# Patient Record
Sex: Male | Born: 1935 | Race: White | Hispanic: No | Marital: Married | Smoking: Former smoker
Health system: Southern US, Community
[De-identification: ages and names within clinical notes are randomized; demographics above are authoritative.]

## PROBLEM LIST (undated history)

## (undated) DIAGNOSIS — M1990 Unspecified osteoarthritis, unspecified site: Secondary | ICD-10-CM

## (undated) DIAGNOSIS — D649 Anemia, unspecified: Secondary | ICD-10-CM

## (undated) DIAGNOSIS — R0602 Shortness of breath: Secondary | ICD-10-CM

## (undated) DIAGNOSIS — E119 Type 2 diabetes mellitus without complications: Secondary | ICD-10-CM

## (undated) DIAGNOSIS — I10 Essential (primary) hypertension: Secondary | ICD-10-CM

## (undated) DIAGNOSIS — K219 Gastro-esophageal reflux disease without esophagitis: Secondary | ICD-10-CM

## (undated) DIAGNOSIS — J449 Chronic obstructive pulmonary disease, unspecified: Secondary | ICD-10-CM

## (undated) DIAGNOSIS — I219 Acute myocardial infarction, unspecified: Secondary | ICD-10-CM

## (undated) DIAGNOSIS — I739 Peripheral vascular disease, unspecified: Secondary | ICD-10-CM

## (undated) DIAGNOSIS — I499 Cardiac arrhythmia, unspecified: Secondary | ICD-10-CM

## (undated) DIAGNOSIS — C3430 Malignant neoplasm of lower lobe, unspecified bronchus or lung: Secondary | ICD-10-CM

## (undated) DIAGNOSIS — E785 Hyperlipidemia, unspecified: Secondary | ICD-10-CM

## (undated) DIAGNOSIS — F419 Anxiety disorder, unspecified: Secondary | ICD-10-CM

## (undated) DIAGNOSIS — I2510 Atherosclerotic heart disease of native coronary artery without angina pectoris: Secondary | ICD-10-CM

## (undated) DIAGNOSIS — Z5189 Encounter for other specified aftercare: Secondary | ICD-10-CM

## (undated) DIAGNOSIS — I509 Heart failure, unspecified: Secondary | ICD-10-CM

## (undated) DIAGNOSIS — R918 Other nonspecific abnormal finding of lung field: Secondary | ICD-10-CM

## (undated) DIAGNOSIS — F329 Major depressive disorder, single episode, unspecified: Secondary | ICD-10-CM

## (undated) DIAGNOSIS — F32A Depression, unspecified: Secondary | ICD-10-CM

## (undated) DIAGNOSIS — Z923 Personal history of irradiation: Secondary | ICD-10-CM

## (undated) DIAGNOSIS — J189 Pneumonia, unspecified organism: Secondary | ICD-10-CM

## (undated) DIAGNOSIS — Z9981 Dependence on supplemental oxygen: Secondary | ICD-10-CM

## (undated) DIAGNOSIS — IMO0001 Reserved for inherently not codable concepts without codable children: Secondary | ICD-10-CM

## (undated) HISTORY — PX: CATARACT EXTRACTION W/ INTRAOCULAR LENS IMPLANT: SHX1309

## (undated) HISTORY — PX: CORONARY ANGIOPLASTY WITH STENT PLACEMENT: SHX49

## (undated) HISTORY — DX: Hyperlipidemia, unspecified: E78.5

## (undated) HISTORY — DX: Malignant neoplasm of lower lobe, unspecified bronchus or lung: C34.30

## (undated) HISTORY — DX: Chronic obstructive pulmonary disease, unspecified: J44.9

## (undated) HISTORY — PX: TONSILLECTOMY: SUR1361

## (undated) HISTORY — DX: Essential (primary) hypertension: I10

## (undated) HISTORY — DX: Other nonspecific abnormal finding of lung field: R91.8

---
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
## 1979-08-27 DIAGNOSIS — I219 Acute myocardial infarction, unspecified: Secondary | ICD-10-CM

## 1979-08-27 HISTORY — DX: Acute myocardial infarction, unspecified: I21.9

## 1989-08-26 DIAGNOSIS — J189 Pneumonia, unspecified organism: Secondary | ICD-10-CM

## 1989-08-26 HISTORY — DX: Pneumonia, unspecified organism: J18.9

## 2006-04-12 ENCOUNTER — Emergency Department (HOSPITAL_COMMUNITY): Admission: EM | Admit: 2006-04-12 | Discharge: 2006-04-12 | Payer: Self-pay | Admitting: Emergency Medicine

## 2009-06-08 ENCOUNTER — Encounter: Admission: RE | Admit: 2009-06-08 | Discharge: 2009-06-08 | Payer: Self-pay | Admitting: Cardiology

## 2009-06-12 ENCOUNTER — Ambulatory Visit (HOSPITAL_COMMUNITY): Admission: RE | Admit: 2009-06-12 | Discharge: 2009-06-12 | Payer: Self-pay | Admitting: Cardiology

## 2010-02-04 ENCOUNTER — Encounter: Payer: Self-pay | Admitting: Internal Medicine

## 2010-02-16 ENCOUNTER — Ambulatory Visit: Payer: Self-pay | Admitting: Internal Medicine

## 2010-02-16 DIAGNOSIS — R0609 Other forms of dyspnea: Secondary | ICD-10-CM

## 2010-02-16 DIAGNOSIS — J449 Chronic obstructive pulmonary disease, unspecified: Secondary | ICD-10-CM

## 2010-02-16 DIAGNOSIS — R0989 Other specified symptoms and signs involving the circulatory and respiratory systems: Secondary | ICD-10-CM

## 2010-02-16 DIAGNOSIS — J4489 Other specified chronic obstructive pulmonary disease: Secondary | ICD-10-CM | POA: Insufficient documentation

## 2010-02-16 DIAGNOSIS — E785 Hyperlipidemia, unspecified: Secondary | ICD-10-CM

## 2010-02-16 DIAGNOSIS — I10 Essential (primary) hypertension: Secondary | ICD-10-CM | POA: Insufficient documentation

## 2010-03-18 ENCOUNTER — Ambulatory Visit: Payer: Self-pay | Admitting: Internal Medicine

## 2010-04-15 ENCOUNTER — Ambulatory Visit: Payer: Self-pay | Admitting: Internal Medicine

## 2010-04-26 ENCOUNTER — Telehealth (INDEPENDENT_AMBULATORY_CARE_PROVIDER_SITE_OTHER): Payer: Self-pay | Admitting: *Deleted

## 2010-04-27 ENCOUNTER — Ambulatory Visit: Payer: Self-pay | Admitting: Internal Medicine

## 2010-12-26 HISTORY — PX: COLONOSCOPY: SHX174

## 2011-01-25 NOTE — Assessment & Plan Note (Signed)
Summary: Pulmonary/ f/u ov better off ACE   Copy to:  Dr. Gilmore Laroche Primary Provider/Referring Provider:  Dr Gilmore Laroche  CC:  1 month followup.  Pt states that his cough and breathing have improved.  He still c/o occ dry cough that he notices most in the late afternoons.  He also still has occ SOB with exertion but states that now it takes more strenuous activity to make him SOB.  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 dry cough and hoarseness,  much worse with certain positions of neck.   rec stop ace, take zantac 150 mg twice daily and w/in few days.  March 18, 2010 1 month followup.  Pt states that his cough and breathing have improved.  He still c/o occ dry cough that he notices most in the late afternoons.  He also still has occ SOB with exertion but states that now it takes more strenuous activity to make him SOB.  Pt denies any significant sore throat, dysphagia, itching, sneezing,  nasal congestion or excess secretions,  fever, chills, sweats, unintended wt loss, pleuritic or exertional cp, hempoptysis, change in activity tolerance  orthopnea pnd or leg swelling.  Current Medications (verified): 1)  Humulin N 100 Unit/ml Susp (Insulin Isophane Human) .Marland Kitchen.. 14 Units Every Am 2)  Humulin R 100 Unit/ml Soln (Insulin Regular Human) .... 6 Units Four Times A Day As Needed 3)  Metformin Hcl 850 Mg Tabs (Metformin Hcl) .Marland Kitchen.. 1 Three Times A Day 4)  Simvastatin 40 Mg Tabs (Simvastatin) .... 1/2 At Bedtime 5)  Coreg 12.5 Mg Tabs (Carvedilol) .... 1/2 Two Times A Day 6)  Amlodipine Besylate 5 Mg Tabs (Amlodipine Besylate) .Marland Kitchen.. 1 Once Daily 7)  Ranitidine Hcl 150 Mg Tabs (Ranitidine Hcl) .Marland Kitchen.. 1 After After Bfast and One At Bedtime 8)  Aspirin 325 Mg Tabs (Aspirin) .Marland Kitchen.. 1 Once Daily 9)  Citalopram Hydrobromide 20 Mg Tabs  (Citalopram Hydrobromide) .Marland Kitchen.. 1 Once Daily 10)  Benicar 40 Mg  Tabs (Olmesartan Medoxomil) .... One Tablet By Mouth Daily  Allergies (verified): No Known Drug Allergies  Past History:  Past Medical History: Diabetes Hyperlipidemia Hypertension    - D/c ACE February 16, 2010 due to pseudoasthma > resolved March 18, 2010   Vital Signs:  Patient profile:   77 year old male Weight:      190 pounds O2 Sat:      95 % on Room air Temp:     97.8 degrees F oral Pulse rate:   68 / minute BP sitting:   114 / 60  (left arm)  Vitals Entered By: Vernie Murders (March 18, 2010 10:41 AM)  O2 Flow:  Room air  Physical Exam  Additional Exam:  amb wm nad no longer with pseudowheeze resolves wt 191 February 16, 2010 > 190 March 18, 2010  HEENT mild turbinate edema.  Oropharynx no thrush or excess pnd or cobblestoning.  No JVD or cervical adenopathy. Mild accessory muscle hypertrophy. Trachea midline, nl thryroid. Chest was hyperinflated by percussion with diminished breath sounds and moderate increased exp time without wheeze. Hoover sign positive at mid inspiration. Regular rate and rhythm without murmur gallop or rub or increase P2 or edema.  Abd: no hsm, nl excursion. Ext warm without cyanosis or clubbing.  Impression & Recommendations:  Problem # 1:  COPD UNSPECIFIED (ICD-496) When respiratory symptoms begin well after a patient reports complete smoking cessation,  it is very hard to "blame" COPD  ie it doesn't make any more sense than hearing a  [employer]SC[employer] driver wrecked his car while driving his kids to school or a Careers adviser sliced his hand off carving [place].  Once the high risk activity stops,  the symptoms should not suddenly erupt.  If so, the differential diagnosis should include  obesity/deconditioning,  LPR/Reflux, CHF, or side effect of medications, esp ace inhibitors which seem to have been contributing here so leave off another 4 weeks then pft's  Problem # 2:  HYPERTENSION  (ICD-401.9)  His updated medication list for this problem includes:    Coreg 12.5 Mg Tabs (Carvedilol) .Marland Kitchen... 1/2 two times a day    Amlodipine Besylate 5 Mg Tabs (Amlodipine besylate) .Marland Kitchen... 1 once daily    Benicar 40 Mg Tabs (Olmesartan medoxomil) ..... One tablet by mouth daily  ok off ace.   ACE inhibitors are problematic in  pts with airway complaints because  even experienced pulmonologists can't always distinguish ace effects from copd/asthma.  By themselves they don't actually cause a problem, much like oxygen can't by itself start a fire, but they certainly serve as a powerful catalyst or enhancer for any "fire"  or inflammatory process in the upper airway, be it caused by an ET  tube or more commonly reflux (especially in the obese or pts with known GERD or who are on biphoshonates).  In the era of ARB near equivalency until we have a better handle on the reversibility of the airway problem, it just makes sense to avoid ace entirely in the short run and then decide later, having established a level of airway control using a reasonable limited regimen, whether to add back ace but even then being very careful to observe the pt for worsening airway control and number of meds used/ needed to control symptoms.    Orders: Est. Patient Level III (29562)  Patient Instructions: 1)  Continue the Benicar for another 4 weeks then return for PFT's 2)  Remember to purse lips if you get short of breath and concentrate on not making noises when breathing 3)  Continue zantac 150 after breakfast and bedtime 4)  GERD (REFLUX)  is a common cause of respiratory symptoms. It commonly presents without heartburn and can be treated with medication, but also with lifestyle changes including avoidance of late meals, excessive alcohol, smoking cessation, and avoid fatty foods, chocolate, peppermint, colas, red wine, and acidic juices such as orange juice. NO MINT OR MENTHOL PRODUCTS SO NO COUGH DROPS  5)  USE  SUGARLESS CANDY INSTEAD (jolley ranchers)  6)  NO OIL BASED VITAMINS

## 2011-01-25 NOTE — Assessment & Plan Note (Signed)
Summary: Pulmonary/ final summary f/u ov   Copy to:  Dr. Gilmore Laroche Primary Provider/Referring Provider:  Dr Gilmore Laroche  CC:  Followup to discuss PFT's and benicar.  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 dry cough and hoarseness,  much worse with certain positions of neck.   rec stop ace, take zantac 150 mg twice daily and w/in few days improved.  March 18, 2010 1 month followup.  Pt states that his cough and breathing have improved.  He still c/o occ dry cough that he notices most in the late afternoons.  He also still has occ SOB with exertion but states that now it takes more strenuous activity to make him SOB.   rec continue zantac, stay off ace  Apr 27, 2010 Followup to discuss PFT's and benicar.  Pt states that his cough has almost completely resolved and has no complaints today. denies limiting sob.  Pt denies any significant sore throat, dysphagia, itching, sneezing,  nasal congestion or excess secretions,  fever, chills, sweats, unintended wt loss, pleuritic or exertional cp, hempoptysis, change in activity tolerance  orthopnea pnd or leg swelling   Current Medications (verified): 1)  Humulin N 100 Unit/ml Susp (Insulin Isophane Human) .Marland Kitchen.. 14 Units Every Am 2)  Humulin R 100 Unit/ml Soln (Insulin Regular Human) .... 6 Units Four Times A Day As Needed 3)  Metformin Hcl 850 Mg Tabs (Metformin Hcl) .Marland Kitchen.. 1 Three Times A Day 4)  Simvastatin 40 Mg Tabs (Simvastatin) .... 1/2 At Bedtime 5)  Coreg 12.5 Mg Tabs (Carvedilol) .... 1/2 Two Times A Day 6)  Amlodipine Besylate 5 Mg Tabs (Amlodipine Besylate) .Marland Kitchen.. 1 Once Daily 7)  Ranitidine Hcl 150 Mg Tabs (Ranitidine Hcl) .Marland Kitchen.. 1 After After Bfast and One At Bedtime 8)  Aspirin 325 Mg  Tabs (Aspirin) .Marland Kitchen.. 1 Once Daily 9)  Citalopram Hydrobromide 20 Mg Tabs (Citalopram Hydrobromide) .Marland Kitchen.. 1 Once Daily 10)  Benicar 40 Mg  Tabs (Olmesartan Medoxomil) .... One Tablet By Mouth Daily  Allergies (verified): No Known Drug Allergies  Past History:  Past Medical History: Diabetes Hyperlipidemia Hypertension    - D/c ACE February 16, 2010 due to pseudoasthma > resolved March 18, 2010  COPD    - PFT's 04/15/10  FEV1 2.50 (92%) ratio 68 DLC0 78%  Vital Signs:  Patient profile:   77 year old male Weight:      194.25 pounds O2 Sat:      94 % on Room air Temp:     97.4 degrees F oral Pulse rate:   62 / minute BP sitting:   122 / 64  (left arm)  Vitals Entered By: Vernie Murders (Apr 27, 2010 11:23 AM)  O2 Flow:  Room air  Physical Exam  Additional Exam:  amb wm nad no longer with pseudowheeze  wt 191 February 16, 2010 > 190 March 18, 2010 > 194 Apr 27, 2010  HEENT mild turbinate edema.  Oropharynx no thrush or excess pnd or cobblestoning.  No JVD or cervical adenopathy. Mild accessory muscle hypertrophy. Trachea midline, nl thryroid. Chest was hyperinflated by percussion with diminished breath sounds and moderate increased exp time without wheeze. Hoover sign positive at mid inspiration.  Regular rate and rhythm without murmur gallop or rub or increase P2 or edema.  Abd: no hsm, nl excursion. Ext warm without cyanosis or clubbing.     Impression & Recommendations:  Problem # 1:  COPD UNSPECIFIED (ICD-496) When respiratory symptoms begin well after a patient reports complete smoking cessation,  it is very hard to "blame" COPD  ie it doesn't make any more sense than hearing a  NASCAR driver wrecked his car while driving his kids to school or a Careers adviser sliced his hand off carving [place].  Once the high risk activity stops,  the symptoms should not suddenly erupt.  If so, the differential diagnosis should include  obesity/deconditioning,  LPR/Reflux, CHF, or side effect of  medications   In this case there is minimal copd by pft's with symptoms all attributable to reflux vs side effects of meds esp ace inhibitors. Concerned about beta blockers too so worth trying off coreg and on bystolic to see if any additional beneft.  would avoid ace but ok to rechallenge with coreg if no perceived benefit.  Problem # 2:  HYPERTENSION (ICD-401.9)  The following medications were removed from the medication list:    Coreg 12.5 Mg Tabs (Carvedilol) .Marland Kitchen... 1/2 two times a day His updated medication list for this problem includes:    Amlodipine Besylate 5 Mg Tabs (Amlodipine besylate) .Marland Kitchen... 1 once daily    Benicar 40 Mg Tabs (Olmesartan medoxomil) ..... One tablet by mouth daily    Bystolic 5 Mg Tabs (Nebivolol hcl) ..... One tablet daily  Orders: Est. Patient Level III (91478)  Medications Added to Medication List This Visit: 1)  Bystolic 5 Mg Tabs (Nebivolol hcl) .... One tablet daily  Complete Medication List: 1)  Humulin N 100 Unit/ml Susp (Insulin isophane human) .Marland Kitchen.. 14 units every am 2)  Humulin R 100 Unit/ml Soln (Insulin regular human) .... 6 units four times a day as needed 3)  Metformin Hcl 850 Mg Tabs (Metformin hcl) .Marland Kitchen.. 1 three times a day 4)  Simvastatin 40 Mg Tabs (Simvastatin) .... 1/2 at bedtime 5)  Amlodipine Besylate 5 Mg Tabs (Amlodipine besylate) .Marland Kitchen.. 1 once daily 6)  Ranitidine Hcl 150 Mg Tabs (Ranitidine hcl) .Marland Kitchen.. 1 after after bfast and one at bedtime 7)  Aspirin 325 Mg Tabs (Aspirin) .Marland Kitchen.. 1 once daily 8)  Citalopram Hydrobromide 20 Mg Tabs (Citalopram hydrobromide) .Marland Kitchen.. 1 once daily 9)  Benicar 40 Mg Tabs (Olmesartan medoxomil) .... One tablet by mouth daily 10)  Bystolic 5 Mg Tabs (Nebivolol hcl) .... One tablet daily  Patient Instructions: 1)  You have minimal damage from smoking and there is no reason why your breathing should limit your activities - if your breathing does limit you I need to see you back 2)  Try bystolic 5 mg one daily in  place of coreg just to see if there's any benefit to your breathing and return to your primiary doctor before your samples run out to decide best options for longterm bp management

## 2011-01-25 NOTE — Assessment & Plan Note (Signed)
Summary: Pulmonary new pt eval/  uacs ? all ace ?   Visit Type:  Initial Consult Copy to:  Dr. Gilmore Laroche Primary Provider/Referring Provider:  Dr Gilmore Laroche  CC:  Dyspnea.  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 dry cough and hoarseness,  much worse with certain positions of neck.   Pt denies any significant sore throat, dysphagia, itching, sneezing,  nasal congestion or excess secretions,  fever, chills, sweats, unintended wt loss, pleuritic or exertional cp, hempoptysis, change in activity tolerance  orthopnea pnd or leg swelling Pt also denies any obvious fluctuation in symptoms with weather or environmental change or other alleviating or aggravating factors.     Pt denies any need for rescue inhaler, denies waking up needing it or having early am exacerbations of coughing/wheezing/ or dyspnea   Current Medications (verified): 1)  Humulin N 100 Unit/ml Susp (Insulin Isophane Human) .Marland Kitchen.. 14 Units Every Am 2)  Humulin R 100 Unit/ml Soln (Insulin Regular Human) .... 6 Units Four Times A Day As Needed 3)  Metformin Hcl 850 Mg Tabs (Metformin Hcl) .Marland Kitchen.. 1 Three Times A Day 4)  Simvastatin 40 Mg Tabs (Simvastatin) .... 1/2 At Bedtime 5)  Coreg 12.5 Mg Tabs (Carvedilol) .... 1/2 Two Times A Day 6)  Lisinopril 40 Mg Tabs (Lisinopril) .Marland Kitchen.. 1 Once Daily 7)  Amlodipine Besylate 5 Mg Tabs (Amlodipine Besylate) .Marland Kitchen.. 1 Once Daily 8)  Ranitidine Hcl 150 Mg Tabs (Ranitidine Hcl) .Marland Kitchen.. 1 Once Daily 9)  Aspirin 325 Mg Tabs (Aspirin) .Marland Kitchen.. 1 Once Daily 10)  Citalopram Hydrobromide 20 Mg Tabs (Citalopram Hydrobromide) .Marland Kitchen.. 1 Once Daily  Allergies (verified): No Known Drug Allergies  Past History:  Past Medical History: Diabetes Hyperlipidemia Hypertension    - D/c ACE February 16, 2010 due to pseudoasthma  Family History: Lung CA-  Mother (not a  smoker) neg atopy or asthma  Social History: Married Children Former smoker.  Quit in the 1970's.  Smoked for 25 yrs up to 1 ppd No ETOH Retired  Review of Systems       The patient complains of shortness of breath with activity, productive cough, acid heartburn, indigestion, sore throat, nasal congestion/difficulty breathing through nose, sneezing, itching, and joint stiffness or pain.  The patient denies shortness of breath at rest, non-productive cough, coughing up blood, chest pain, irregular heartbeats, loss of appetite, weight change, abdominal pain, difficulty swallowing, tooth/dental problems, headaches, ear ache, anxiety, depression, hand/feet swelling, rash, change in color of mucus, and fever.    Vital Signs:  Patient profile:   77 year old male Height:      71 inches Weight:      191.13 pounds BMI:     26.75 O2 Sat:      93 % on Room air Temp:     97.6 degrees F oral Pulse rate:   74 / minute BP sitting:   140 / 60  (left arm)  Vitals Entered By: Vernie Murders (February 16, 2010 11:51 AM)  O2 Flow:  Room air  Physical Exam  Additional Exam:  amb wm nad wt 191 February 16, 2010  HEENT mild turbinate edema.  Oropharynx no thrush or excess pnd or cobblestoning.  No JVD or cervical adenopathy. Mild accessory muscle hypertrophy. Trachea midline, nl thryroid. Chest was hyperinflated by percussion with diminished breath sounds and  moderate increased exp time without wheeze. Hoover sign positive at mid inspiration. Regular rate and rhythm without murmur gallop or rub or increase P2 or edema.  Abd: no hsm, nl excursion. Ext warm without cyanosis or clubbing.     CXR  Procedure date:  02/16/2010  Findings:        Comparison: Chest 06/12/2009.   Findings: Lungs are hyperexpanded compatible with emphysema.  No focal airspace disease or effusion.  Heart size normal.   IMPRESSION: Emphysema without acute disease.  Impression & Recommendations:  Problem # 1:  COPD  UNSPECIFIED (ICD-496)  When respiratory symptoms begin well after a patient reports complete smoking cessation,  it is very hard to "blame" COPD  ie it doesn't make any more sense than hearing a  NASCAR driver wrecked his car while driving his kids to school or a Careers adviser sliced his hand off carving [place].  Once the high risk activity stops,  the symptoms should not suddenly erupt.  If so, the differential diagnosis should include  obesity/deconditioning,  LPR/Reflux, CHF, or side effect of medications, esp ace inhibitors, which should be stopped on a trial basis then return to regroup  Orders: New Patient Level V (57846)  Problem # 2:  DYSPNEA (ICD-786.09) Fits best with Upper airway cough syndrome, so named because it's frequently impossible to sort out how much is LPR vs CR/sinusitis with freq throat clearing generating secondary extra esophageal GERD from wide swings in gastric pressure that occur with throat clearing, promoting self use of mint and menthol lozenges that reduce the lower esophageal sphincter tone and exacerbate the problem further.  These symptoms are easily confused with asthma/copd by even experienced pulmonogists because they overlap so much. These are the same pts who not infrequently have failed to tolerate ace inhibitors,  dry powder inhalers or biphosphonates or report having reflux symptoms that don't respond to standard doses of PPI   Try off ace as fist step, see diet  Problem # 3:  HYPERTENSION (ICD-401.9)  The following medications were removed from the medication list:    Lisinopril 40 Mg Tabs (Lisinopril) .Marland Kitchen... 1 once daily His updated medication list for this problem includes:    Coreg 12.5 Mg Tabs (Carvedilol) .Marland Kitchen... 1/2 two times a day    Amlodipine Besylate 5 Mg Tabs (Amlodipine besylate) .Marland Kitchen... 1 once daily    Benicar 40 Mg Tabs (Olmesartan medoxomil) ..... One tablet by mouth daily  ACE inhibitors are problematic in  pts with airway complaints because  even  experienced pulmononlogists can't always distinguish ace effects from copd/asthma.  By themselves they don't actually cause a problem, much like oxygen can't by itself start a fire, but they certainly serve as a powerful catalyst or enhancer for any "fire"  or inflammatory process in the upper airway, be it caused by an ET  tube or more commonly reflux (especially in the obese or pts with known GERD or who are on biphoshonates).  In the era of ARB near equivalency until we have a better handle on the reversibility of the airway problem, it just makes sense to avoid ace entirely in the short run and then decide later, having established a level of airway control using a reasonable limited regimen, whether to add back ace but even then being very careful to observe the pt for worsening airway control and number of meds used/ needed to control symptoms.    Orders: New Patient Level V (96295)  Medications Added to Medication List This Visit: 1)  Humulin N 100 Unit/ml Susp (Insulin isophane human) .Marland Kitchen.. 14 units every am 2)  Humulin R 100 Unit/ml Soln (Insulin regular human) .... 6 units four times a day as needed 3)  Metformin Hcl 850 Mg Tabs (Metformin hcl) .Marland Kitchen.. 1 three times a day 4)  Simvastatin 40 Mg Tabs (Simvastatin) .... 1/2 at bedtime 5)  Coreg 12.5 Mg Tabs (Carvedilol) .... 1/2 two times a day 6)  Lisinopril 40 Mg Tabs (Lisinopril) .Marland Kitchen.. 1 once daily 7)  Amlodipine Besylate 5 Mg Tabs (Amlodipine besylate) .Marland Kitchen.. 1 once daily 8)  Ranitidine Hcl 150 Mg Tabs (Ranitidine hcl) .Marland Kitchen.. 1 once daily 9)  Ranitidine Hcl 150 Mg Tabs (Ranitidine hcl) .Marland Kitchen.. 1 after after bfast and one at bedtime 10)  Aspirin 325 Mg Tabs (Aspirin) .Marland Kitchen.. 1 once daily 11)  Citalopram Hydrobromide 20 Mg Tabs (Citalopram hydrobromide) .Marland Kitchen.. 1 once daily 12)  Benicar 40 Mg Tabs (Olmesartan medoxomil) .... One tablet by mouth daily  Other Orders: T-2 View CXR (71020TC)  Patient Instructions: 1)  Stop lisnopril 2)  Start Benicar 40  mg 1 daily  in it's place - if it makes you lightheaded just take half a pill a day 3)  Change ranitidine to 150 one after breakfast and one at bedtime 4)  GERD (REFLUX)  is a common cause of respiratory symptoms. It commonly presents without heartburn and can be treated with medication, but also with lifestyle changes including avoidance of late meals, excessive alcohol, smoking cessation, and avoid fatty foods, chocolate, peppermint, colas, red wine, and acidic juices such as orange juice. NO MINT OR MENTHOL PRODUCTS SO NO COUGH DROPS  5)  USE SUGARLESS CANDY INSTEAD (jolley ranchers)  6)  NO OIL BASED VITAMINS  7)  Please schedule a follow-up appointment in 1 month.

## 2011-01-25 NOTE — Miscellaneous (Signed)
Summary: Orders Update pft charges  Clinical Lists Changes  Orders: Added new Service order of Carbon Monoxide diffusing w/capacity (94720) - Signed Added new Service order of Lung Volumes (94240) - Signed Added new Service order of Spirometry (Pre & Post) (94060) - Signed 

## 2011-01-25 NOTE — Progress Notes (Signed)
Summary: Needs final f/u ov and benicar samples  Phone Note Call from Patient Call back at Home Phone [phone]   Caller: Spouse Call For: wert Summary of Call: spouse states that sample for benicar is complete. should this be called in now for pt? if so- they use [facility]. also wants to know results of pft.  Initial call taken by: Tivis Ringer, CNA,  Apr 26, 2010 10:36 AM  Follow-up for Phone Call        Per last ov note pt was to continue benicar x 4 weeks and then return for PFT. Pt has PFT on 04/15/10. Pt wants to know results of PFT and should he continue Benicar, if so needs refill. Please advise. Carron Curie CMA  Apr 26, 2010 10:47 AM sorry, the f/u was done the way I intended.  Needs a final f/u to decide whether to continue benicar or alternative longterm -best if we make the appt for next avail and just give him enough benicar samples until ov and sort out longterm needs then.  The pft showed minimal airflow obst Follow-up by: Nyoka Cowden MD,  Apr 26, 2010 1:12 PM  Additional Follow-up for Phone Call Additional follow up Details #1::        Spoke with pt's spouse and sched pt appt with MW for tommorrow am at 11:15 am.  Pt has enough sample to last until ov tommorrow. Additional Follow-up by: Vernie Murders,  Apr 26, 2010 1:44 PM

## 2011-04-04 LAB — POCT I-STAT 3, ART BLOOD GAS (G3+)
O2 Saturation: 98 %
pCO2 arterial: 45.4 mmHg — ABNORMAL HIGH (ref 35.0–45.0)
pO2, Arterial: 101 mmHg — ABNORMAL HIGH (ref 80.0–100.0)

## 2011-04-04 LAB — POCT I-STAT 3, VENOUS BLOOD GAS (G3P V)
Acid-base deficit: 3 mmol/L — ABNORMAL HIGH (ref 0.0–2.0)
Bicarbonate: 24.3 mEq/L — ABNORMAL HIGH (ref 20.0–24.0)
TCO2: 26 mmol/L (ref 0–100)
pCO2, Ven: 50.2 mmHg — ABNORMAL HIGH (ref 45.0–50.0)
pH, Ven: 7.293 (ref 7.250–7.300)
pO2, Ven: 42 mmHg (ref 30.0–45.0)

## 2011-04-04 LAB — GLUCOSE, CAPILLARY: Glucose-Capillary: 122 mg/dL — ABNORMAL HIGH (ref 70–99)

## 2011-04-29 ENCOUNTER — Ambulatory Visit (INDEPENDENT_AMBULATORY_CARE_PROVIDER_SITE_OTHER): Payer: Medicare PPO | Admitting: Cardiovascular Disease

## 2011-04-29 ENCOUNTER — Encounter: Payer: Self-pay | Admitting: Cardiovascular Disease

## 2011-04-29 DIAGNOSIS — E785 Hyperlipidemia, unspecified: Secondary | ICD-10-CM

## 2011-04-29 DIAGNOSIS — R0600 Dyspnea, unspecified: Secondary | ICD-10-CM

## 2011-04-29 DIAGNOSIS — J449 Chronic obstructive pulmonary disease, unspecified: Secondary | ICD-10-CM

## 2011-04-29 DIAGNOSIS — I447 Left bundle-branch block, unspecified: Secondary | ICD-10-CM | POA: Insufficient documentation

## 2011-04-29 DIAGNOSIS — I2510 Atherosclerotic heart disease of native coronary artery without angina pectoris: Secondary | ICD-10-CM | POA: Insufficient documentation

## 2011-04-29 DIAGNOSIS — R9431 Abnormal electrocardiogram [ECG] [EKG]: Secondary | ICD-10-CM

## 2011-04-29 DIAGNOSIS — I10 Essential (primary) hypertension: Secondary | ICD-10-CM

## 2011-04-29 DIAGNOSIS — R0609 Other forms of dyspnea: Secondary | ICD-10-CM

## 2011-04-29 NOTE — Patient Instructions (Signed)
Your physician has requested that you have an echocardiogram. Echocardiography is a painless test that uses sound waves to create images of your heart. It provides your doctor with information about the size and shape of your heart and how well your heart's chambers and valves are working. This procedure takes approximately one hour. There are no restrictions for this procedure.  Your physician has requested that you have a lexiscan myoview. For further information please visit [email or web address] Please follow instruction sheet, as given.  Your physician recommends that you schedule a follow-up appointment in: after test are complete schedule appt with Dr. Eden Emms

## 2011-04-29 NOTE — Assessment & Plan Note (Signed)
No old ECG;s in [facility]  Will see if they have some at [facility] or [place]  No syncope or heart block

## 2011-04-29 NOTE — Assessment & Plan Note (Signed)
Cath 2010 with no epicardial CAD.  ? Spasm in past.  Imdur is making him dizzy and severe headache.  Told him to stop it

## 2011-04-29 NOTE — Assessment & Plan Note (Signed)
Cholesterol is at goal.  Continue current dose of statin and diet Rx.  No myalgias or side effects.  F/U  LFT's in 6 months. No results found for this basename: LDLCALC             

## 2011-04-29 NOTE — Progress Notes (Signed)
77 yo referred by Dr Tanya Nones for dyspnea.  Unfortunatley he has multiple records at the [facility] and 3 years ago from Dr Jacinto Halim when he was at [facility] none of which I currently have.  [facility] but he didn't want to wait 3 months for it.  Dyspnea has been progressive over the last year.  Dr Sherene Sires feels he has mild COPD. Previous smoker.  ? Made worse with ACE and beta blockers so meds adjusted.  Denies SSCP.  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  "History of MI" at that time.  Denies edema, PND, orthopnea or signs of CHF.  No palpitations or syncope.    Reviewed records in [place].  Had a totally normal right and left heart cath by Dr Jacinto Halim in June of 2010  ROS: Denies fever, malais, weight loss, blurry vision, decreased visual acuity, cough, sputum, SOB, hemoptysis, pleuritic pain, palpitaitons, heartburn, abdominal pain, melena, lower extremity edema, claudication, or rash.   General: Affect appropriate Healthy:  appears stated age HEENT: normal Neck supple with no adenopathy JVP normal no bruits no thyromegaly Lungs clear with no wheezing and good diaphragmatic motion Heart:  S1/S2 no murmur,rub, gallop or click PMI normal Abdomen: benighn, BS positve, no tenderness, no AAA no bruit.  No HSM or HJR Distal pulses intact with no bruits No edema Neuro non-focal Skin warm and dry No muscular weakness  Medications Current Outpatient Prescriptions  Medication Sig Dispense Refill  . aspirin 325 MG tablet Take 325 mg by mouth daily.        Marland Kitchen atorvastatin (LIPITOR) 40 MG tablet Take 40 mg by mouth daily.        . carvedilol (COREG) 12.5 MG tablet 1/2 tab po qd       . cetirizine (ZYRTEC) 10 MG tablet Take 10 mg by mouth daily.        . citalopram (CELEXA) 40 MG tablet Take 40 mg by mouth daily.        . isosorbide mononitrate (IMDUR) 30 MG 24 hr tablet Take 30 mg by mouth daily.        Marland Kitchen  losartan (COZAAR) 100 MG tablet Take 100 mg by mouth daily.        . metFORMIN (GLUCOPHAGE) 850 MG tablet Take 850 mg by mouth 3 (three) times daily.        . Multiple Vitamin (MULTIVITAMIN) capsule Take 1 capsule by mouth daily.        . nitroGLYCERIN (NITROSTAT) 0.4 MG SL tablet Place 0.4 mg under the tongue every 5 (five) minutes as needed.        Marland Kitchen omeprazole (PRILOSEC) 40 MG capsule Take 40 mg by mouth daily.          Allergies Review of patient's allergies indicates no known allergies.  Family History: No family history on file.  Social History: History   Social History  . Marital Status: Single    Spouse Name: N/A    Number of Children: N/A  . Years of Education: N/A   Occupational History  . Not on file.   Social History Main Topics  . Smoking status: Former Games developer  . Smokeless tobacco: Not on file  . Alcohol Use: Not on file  . Drug Use: Not on file  . Sexually Active: Not on file   Other Topics Concern  . Not on file  Social History Narrative  . No narrative on file    Electrocardiogram:  NSR LBBB  Assessment and Plan

## 2011-04-29 NOTE — Assessment & Plan Note (Signed)
Will see if echo done recently at [place].  If so just do myovue to assess EF and R/O CAD

## 2011-04-29 NOTE — Assessment & Plan Note (Signed)
F/U Dr Sherene Sires.  Mild by his account but shows up on CXR.  May need CT to R/O chronic PE or ILD.  Doubt we will find cardiac cause with normal right and left cath 2 years ago.  Has tremor in LUE ? Neuromuscular etiology to dyspnea

## 2011-04-29 NOTE — Assessment & Plan Note (Signed)
Well controlled.  Continue current medications and low sodium Dash type diet.    

## 2011-05-02 ENCOUNTER — Encounter: Payer: Self-pay | Admitting: Cardiovascular Disease

## 2011-05-10 ENCOUNTER — Encounter: Payer: Self-pay | Admitting: Cardiovascular Disease

## 2011-05-10 ENCOUNTER — Ambulatory Visit (HOSPITAL_COMMUNITY): Payer: Medicare PPO | Attending: Cardiovascular Disease | Admitting: Radiology

## 2011-05-10 ENCOUNTER — Ambulatory Visit (HOSPITAL_BASED_OUTPATIENT_CLINIC_OR_DEPARTMENT_OTHER): Payer: Medicare PPO

## 2011-05-10 DIAGNOSIS — R0989 Other specified symptoms and signs involving the circulatory and respiratory systems: Secondary | ICD-10-CM | POA: Insufficient documentation

## 2011-05-10 DIAGNOSIS — R0609 Other forms of dyspnea: Secondary | ICD-10-CM | POA: Insufficient documentation

## 2011-05-10 DIAGNOSIS — R0600 Dyspnea, unspecified: Secondary | ICD-10-CM

## 2011-05-10 DIAGNOSIS — R9431 Abnormal electrocardiogram [ECG] [EKG]: Secondary | ICD-10-CM

## 2011-05-10 DIAGNOSIS — R0602 Shortness of breath: Secondary | ICD-10-CM

## 2011-05-10 MED ORDER — TECHNETIUM TC 99M TETROFOSMIN IV KIT
10.4000 | PACK | Freq: Once | INTRAVENOUS | Status: AC | PRN
  Administered 2011-05-10: 10 via INTRAVENOUS

## 2011-05-10 MED ORDER — ADENOSINE (DIAGNOSTIC) 3 MG/ML IV SOLN
0.5600 mg/kg | Freq: Once | INTRAVENOUS | Status: AC
  Administered 2011-05-10: 48.3 mg via INTRAVENOUS

## 2011-05-10 MED ORDER — TECHNETIUM TC 99M TETROFOSMIN IV KIT
33.0000 | PACK | Freq: Once | INTRAVENOUS | Status: AC | PRN
  Administered 2011-05-10: 33 via INTRAVENOUS

## 2011-05-10 NOTE — Progress Notes (Signed)
[facility] SITE 3 NUCLEAR MED [address] [place] [address] [phone]  Cardiology Nuclear Med Study  Hunter Howard is a 77 y.o. male [id number] Dec 26, 1927   Nuclear Med Background Indication for Stress Test:  Evaluation for Ischemia and Abnormal EKG History:  COPD,'2010 Heart Catheterization; EF 50% No Obstruction and 1992 Myocardial Infarction Cardiac Risk Factors: History of Smoking, Hypertension, LBBB, Lipids and NIDDM  Symptoms:  DOE, Fatigue, Palpitations and SOB   Nuclear Pre-Procedure Caffeine/Decaff Intake:  None NPO After: 9:00pm   Lungs:  clear IV 0.9% NS with Angio Cath:  20g  IV Site: R Wrist  IV Started by:  Cathlyn Parsons, RN  Chest Size (in):  46 Cup Size: n/a  Height: 5\' 11"  (1.803 m)  Weight:  190 lb (86.183 kg)  BMI:  Body mass index is 26.50 kg/(m^2). Tech Comments:  Coreg held x 12 hr. BS at home this am 145;no Metformin taken.    Nuclear Med Study 1 or 2 day study: 1 day  Stress Test Type:  Adenosine  Reading MD: Marca Ancona, MD  Order Authorizing Provider:  P.Nishan  Resting Radionuclide: Technetium 51m Tetrofosmin  Resting Radionuclide Dose: 10.4 mCi   Stress Radionuclide:  Technetium 33m Tetrofosmin  Stress Radionuclide Dose: 33 mCi           Stress Protocol Rest HR: 69 Stress HR: 77  Rest BP: 141/61 Stress BP: 135/67  Exercise Time (min): n/a METS: n/a   Predicted Max HR: 139 bpm % Max HR: 55.4 bpm Rate Pressure Product: 21308   Dose of Adenosine (mg):  48.4 Dose of Lexiscan: n/a mg  Dose of Atropine (mg): n/a Dose of Dobutamine: n/a mcg/kg/min (at max HR)  Stress Test Technologist: Milana Na, EMT-P  Nuclear Technologist:  Doyne Keel, CNMT     Rest Procedure:  Myocardial perfusion imaging was performed at rest 45 minutes following the intravenous administration of Technetium 35m Tetrofosmin. Rest ECG: NSR-LBBB  Stress Procedure:  The patient received IV adenosine at 140 mcg/kg/min for 4 minutes.   There were no significant changes with infusion.  Technetium 46m Tetrofosmin was injected at the 2 minute mark and quantitative spect images were obtained after a 45 minute delay. Stress ECG: Uninteretable due to baseline LBBB  QPS Raw Data Images:  There is significant gut attenuation overlying the inferior wall on both the rest and stress images; worse with rest. The inferior wall perfusion cannot be assess adequately. Stress Images:  There is decreased uptake in the inferior, infero-septal and apical walls. Rest Images:  There is decreased uptake in the inferior, infero-septal and apical walls Subtraction (SDS):  Perfusion defects as described above are worse with rest and somewhat improved with stress. Suspect this is mainly due to gut attenuation but cannot exclude component of previous infarct. No ischemia. Transient Ischemic Dilatation (Normal <1.22):  0.95 Lung/Heart Ratio (Normal <0.45):  0.27  Quantitative Gated Spect Images QGS EDV:  179 ml QGS ESV:  85 ml QGS cine images:  Distal septal and apical dyskinesis. Possibly due to LBBB. QGS EF: 53%  Impression Exercise Capacity:  Adenosine study with no exercise. BP Response:  n/a Clinical Symptoms:  n/a ECG Impression:  Baseline:  LBBB.  EKG uninterpretable due to LBBB at rest and stress. Comparison with Prior Nuclear Study: No images to compare  Overall Impression:  Non diagnostic stress nuclear study. There is extensive gut attenuation overlying the inferior wall which makes interpretation of this test very difficult. There is decreased  uptake in the inferior, inferoseptal and apical walls which is worse with rest and somewhat improved with stress. Suspect this is mainly due to gut attenuation but cannot exclude component of previous infarct. No ischemia.

## 2011-05-10 NOTE — Cardiovascular Report (Signed)
NAMEJAVARIOUS, ELSAYED NO.:  [id number]   MEDICAL RECORD NO.:  [id number]          PATIENT TYPE:  OIB   LOCATION:  2899                         FACILITY:  [facility]   PHYSICIAN:  Cristy Hilts. Jacinto Halim, MD       DATE OF BIRTH:  [birth year]   DATE OF PROCEDURE:  06/12/2009  DATE OF DISCHARGE:  06/12/2009                            CARDIAC CATHETERIZATION   PROCEDURES PERFORMED:  1. Left ventriculography.  2. Selective right and left coronary arteriography.  3. Ascending aortogram.  4. Abdominal aortogram.  5. Right heart catheterization.   INDICATIONS:  Hunter Howard is a 77 year old gentleman with history of  questionable for myocardial infarction about 18 years ago when he  presented with shortness of breath.  He had been doing well until  recently, has noticed dyspnea on exertion which is his anginal  equivalent.  Because of this, we had discussed regarding Myoview stress  testing versus cardiac catheterization.  He wanted to proceed direct  left cardiac catheterization given the fact that his shortness of breath  was pretty significant.  He was now brought to the cardiac  catheterization lab to evaluate his coronary anatomy.  Right heart  catheterization was performed to evaluate for pulmonary hypertension.   HEMODYNAMIC DATA RIGHT HEART CATHETERIZATION:  RA pressure 7/0, mean 4  mmHg.   RV pressure 25/4, end-diastolic pressure 7 mmHg.   PA pressure 23/10, mean 15 mmHg, PA saturation 72%.   Pulmonary capillary wedge 10/12, mean 8 mmHg, aortic saturation 98%.   Cardiac output 5.63, cardiac index 2.71 by Fick.   HEMODYNAMIC DATA LEFT HEART CATHETERIZATION:  Ventricular pressure was  124/4 with end-diastolic pressure of 8 mmHg.  Aortic pressure 127/58,  mean of 86 mmHg without any significant pressure gradient across the  aortic valve.   ANGIOGRAPHIC DATA:  Left ventricle:  Left ventricular systolic function  mild global hypokinesis with ejection fraction of 50% with mild  global  hypokinesis without any focal wall motion abnormality or mitral  regurgitation.   Right coronary artery:  Right coronary artery is a large-caliber vessel  and a dominant vessel giving origin to large PDA and large PLA branch  and has diffuse mild luminal irregularity.   Left main coronary:  Left main coronary artery shows a mild 20% left  main ostial stenosis.   Circumflex:  Circumflex coronary artery is a large vessel giving origin  to large obtuse marginal I.  Circumflex has mild diffuse luminal  irregularity.   LAD:  LAD is a large-caliber vessel giving origin to large diagonal II  and LAD has mild diffuse luminal irregularity.   Ascending aortogram:  Ascending aortogram for shortness of breath and to  evaluate for aortic atherosclerosis, revealed no evidence of aortic  regurgitation or ascending aortic aneurysm.   Abdominal aortogram:  Abdominal aortogram being done for abdominal  atherosclerosis and abdominal aortic aneurysm, revealed mild luminal  irregularity of the abdominal aorta without any evidence of renal artery  stenosis.  Aortoiliac bifurcation was widely patent.   IMPRESSION:  No severe coronary arteries by cardiac catheterization.  Mild luminal irregularities were noted.  Normal left and right heart  pressures.   RECOMMENDATIONS:  Evaluation for noncardiac cause of chest pain is  indicated.  He will be discharged home today.   A total of 80 mL of contrast was utilized for diagnostic angiography.   TECHNIQUE OF THE PROCEDURE:  Under usual sterile precautions, using a 6-  French right femoral arterial and a 7-French right femoral venous  access, right and left heart catheterization was performed.  Using a 6-  [place] multipurpose B2 catheter which was advanced into the ascending  aorta, then into left ventricle.  Left ventriculography was performed  both in LAO and RAO projections.  Catheter pulled into the ascending  aorta.  Right coronary artery was  selectively engaged and angiography  was performed, then left main coronary artery was selectively engaged  and angiography was performed, then the ascending aortogram was  performed in the LAO projection and abdominal aortogram in the AP  projection, catheter was pulled out of the body.   A balloon-tip Swan-Ganz catheter was easily advanced into the pulmonary  artery and pulmonary capillary wedge was obtained and right-sided  hemodynamics were carefully performed and cardiac output was calculated  by Fick.  The catheter was then withdrawn out of the body.  The patient  tolerated the procedure were.  No immediate complications.      Cristy Hilts. Jacinto Halim, MD     JRG/MEDQ  D:  06/12/2009  T:  06/12/2009  Job:  [id number]   cc:   Broadus John T. Pamalee Leyden, MD

## 2011-05-11 NOTE — Progress Notes (Signed)
Copy routed to Dr. Nishan.Falecha Clark ° °

## 2011-05-17 ENCOUNTER — Ambulatory Visit (INDEPENDENT_AMBULATORY_CARE_PROVIDER_SITE_OTHER): Payer: Medicare PPO | Admitting: Cardiovascular Disease

## 2011-05-17 ENCOUNTER — Encounter: Payer: Self-pay | Admitting: Cardiovascular Disease

## 2011-05-17 DIAGNOSIS — I509 Heart failure, unspecified: Secondary | ICD-10-CM

## 2011-05-17 DIAGNOSIS — E785 Hyperlipidemia, unspecified: Secondary | ICD-10-CM

## 2011-05-17 DIAGNOSIS — R0609 Other forms of dyspnea: Secondary | ICD-10-CM

## 2011-05-17 DIAGNOSIS — I10 Essential (primary) hypertension: Secondary | ICD-10-CM

## 2011-05-17 DIAGNOSIS — I5023 Acute on chronic systolic (congestive) heart failure: Secondary | ICD-10-CM | POA: Insufficient documentation

## 2011-05-17 DIAGNOSIS — I2510 Atherosclerotic heart disease of native coronary artery without angina pectoris: Secondary | ICD-10-CM

## 2011-05-17 DIAGNOSIS — I447 Left bundle-branch block, unspecified: Secondary | ICD-10-CM

## 2011-05-17 MED ORDER — FUROSEMIDE 20 MG PO TABS
20.0000 mg | ORAL_TABLET | Freq: Every day | ORAL | Status: DC
Start: 2011-05-17 — End: 2011-07-01

## 2011-05-17 MED ORDER — POTASSIUM CHLORIDE 10 MEQ PO TBCR: 10.0000 meq | EXTENDED_RELEASE_TABLET | Freq: Every day | ORAL | Status: DC

## 2011-05-17 NOTE — Progress Notes (Signed)
Addended by: Katina Dung on: 05/17/2011 09:30 AM   Modules accepted: Orders

## 2011-05-17 NOTE — Patient Instructions (Addendum)
Take Lasix(furosemide) 20mg  daily.  Take KCL(potassium) 10 mEq daily.  Schedule an appointment for to see Dr Eden Emms in 4 weeks.  Schedule an appointment for lab in 4 weeks when you see Dr Judeth Horn

## 2011-05-17 NOTE — Assessment & Plan Note (Signed)
Well controlled.  Continue current medications and low sodium Dash type diet.    

## 2011-05-17 NOTE — Assessment & Plan Note (Signed)
Vague history.  No SSCP and no ischemia on myovue.  Again dont think cath is indicated

## 2011-05-17 NOTE — Progress Notes (Signed)
77 yo referred by Dr Tanya Nones for dyspnea. Unfortunatley he has multiple records at the [facility] and 3 years ago from Dr Jacinto Halim when he was at [facility] none of which I currently have. [facility] but he didn't want to wait 3 months for it. Dyspnea has been progressive over the last year. Dr Sherene Sires feels he has mild COPD. Previous smoker. ? Made worse with ACE and beta blockers so meds adjusted. Denies SSCP. 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 "History of MI" at that time. Denies edema, PND, orthopnea or signs of CHF. No palpitations or syncope.  Reviewed records in [place]. Had a totally normal right and left heart cath by Dr Jacinto Halim in June of 2010  F/U nuclear showed no ischemia but significant gut attenuation made intrepretation difficult F/U Echo reviewed EF 35% abnormal septum from LBBB.  Diffuse hypokinesis worse in the apex and septum  Interestingly I reviewed records form [place] in 2/12 and echo indicated EF 50-55%  ROS: Denies fever, malais, weight loss, blurry vision, decreased visual acuity, cough, sputum, SOB, hemoptysis, pleuritic pain, palpitaitons, heartburn, abdominal pain, melena, lower extremity edema, claudication, or rash.   General: Affect appropriate Healthy:  appears stated age HEENT: normal Neck supple with no adenopathy JVP normal no bruits no thyromegaly Lungs clear with no wheezing and good diaphragmatic motion Heart:  S1/S2 no murmur,rub, gallop or click PMI normal Abdomen: benighn, BS positve, no tenderness, no AAA no bruit.  No HSM or HJR Distal pulses intact with no bruits No edema Neuro non-focal Skin warm and dry No muscular weakness   Current Outpatient Prescriptions  Medication Sig Dispense Refill  . amLODipine (NORVASC) 5 MG tablet Take 5 mg by mouth daily.        Marland Kitchen aspirin 325 MG tablet Take 325 mg by mouth daily.        Marland Kitchen atorvastatin (LIPITOR)  40 MG tablet Take 40 mg by mouth daily.        . carvedilol (COREG) 12.5 MG tablet 1/2 tab po qd       . cetirizine (ZYRTEC) 10 MG tablet Take 10 mg by mouth daily.        . citalopram (CELEXA) 40 MG tablet Take 40 mg by mouth daily.        . insulin glargine (LANTUS) 100 UNIT/ML injection Inject into the skin at bedtime.        . insulin regular (HUMULIN R) 100 UNIT/ML injection Inject 6 Units into the skin 4 (four) times daily as needed.        Marland Kitchen losartan (COZAAR) 100 MG tablet Take 100 mg by mouth daily.        . meclizine (MEDI-MECLIZINE) 25 MG tablet Take 25 mg by mouth 3 (three) times daily as needed.        . metFORMIN (GLUCOPHAGE) 850 MG tablet Take 850 mg by mouth 3 (three) times daily.        . Multiple Vitamin (MULTIVITAMIN) capsule Take 1 capsule by mouth daily.        . nitroGLYCERIN (NITROSTAT) 0.4 MG SL tablet Place 0.4 mg under the tongue every 5 (five) minutes as needed.        . NON FORMULARY Lantanoprost Both eyes qhs       . omeprazole (PRILOSEC) 40 MG capsule Take 40 mg by mouth daily.        Marland Kitchen  DISCONTD: insulin NPH (HUMULIN N) 100 UNIT/ML injection Inject 14 Units into the skin every morning.        . isosorbide mononitrate (IMDUR) 30 MG 24 hr tablet Take 30 mg by mouth daily.        Marland Kitchen DISCONTD: nebivolol (BYSTOLIC) 5 MG tablet Take 5 mg by mouth daily.        Marland Kitchen DISCONTD: olmesartan (BENICAR) 40 MG tablet Take 40 mg by mouth daily.        Marland Kitchen DISCONTD: ranitidine (ZANTAC) 150 MG capsule Take 150 mg by mouth 2 (two) times daily. One after breakfast, and one at bedtime       . DISCONTD: simvastatin (ZOCOR) 40 MG tablet Take 20 mg by mouth at bedtime.          Allergies  Review of patient's allergies indicates no known allergies.  Electrocardiogram:  Assessment and Plan

## 2011-05-17 NOTE — Assessment & Plan Note (Signed)
Component of dyspnea may be from low EF recorded by echo. On ARB, BB and nitrates already.  Add low dose lasix and KCL  F/U BMET and BNP in 4 weeks. Dont think cath indicated at this time

## 2011-05-17 NOTE — Assessment & Plan Note (Signed)
Cholesterol is at goal.  Continue current dose of statin and diet Rx.  No myalgias or side effects.  F/U  LFT's in 6 months. No results found for this basename: LDLCALC             

## 2011-05-18 NOTE — Progress Notes (Signed)
Patient is aware of test/lab results. Will f/u with Nishan in July, 2012.

## 2011-07-01 ENCOUNTER — Encounter: Payer: Self-pay | Admitting: Cardiovascular Disease

## 2011-07-01 ENCOUNTER — Other Ambulatory Visit (INDEPENDENT_AMBULATORY_CARE_PROVIDER_SITE_OTHER): Payer: Medicare PPO | Admitting: *Deleted

## 2011-07-01 ENCOUNTER — Ambulatory Visit (INDEPENDENT_AMBULATORY_CARE_PROVIDER_SITE_OTHER): Payer: Medicare PPO | Admitting: Cardiovascular Disease

## 2011-07-01 VITALS — BP 132/68 | HR 76 | Ht 73.0 in | Wt 190.0 lb

## 2011-07-01 DIAGNOSIS — I10 Essential (primary) hypertension: Secondary | ICD-10-CM

## 2011-07-01 DIAGNOSIS — I2510 Atherosclerotic heart disease of native coronary artery without angina pectoris: Secondary | ICD-10-CM

## 2011-07-01 DIAGNOSIS — R0989 Other specified symptoms and signs involving the circulatory and respiratory systems: Secondary | ICD-10-CM

## 2011-07-01 DIAGNOSIS — I447 Left bundle-branch block, unspecified: Secondary | ICD-10-CM

## 2011-07-01 DIAGNOSIS — R0609 Other forms of dyspnea: Secondary | ICD-10-CM

## 2011-07-01 DIAGNOSIS — E785 Hyperlipidemia, unspecified: Secondary | ICD-10-CM

## 2011-07-01 DIAGNOSIS — I509 Heart failure, unspecified: Secondary | ICD-10-CM

## 2011-07-01 LAB — BASIC METABOLIC PANEL
Chloride: 106 mEq/L (ref 96–112)
Potassium: 4.2 mEq/L (ref 3.5–5.1)

## 2011-07-01 LAB — BRAIN NATRIURETIC PEPTIDE: Pro B Natriuretic peptide (BNP): 42 pg/mL (ref 0.0–100.0)

## 2011-07-01 NOTE — Assessment & Plan Note (Signed)
Well controlled.  Continue current medications and low sodium Dash type diet.    

## 2011-07-01 NOTE — Progress Notes (Signed)
77 yo referred by Dr Tanya Nones for dyspnea. Unfortunatley he has multiple records at the [facility] and 3 years ago from Dr Jacinto Halim when he was at [facility] none of which I currently have. [facility] thought [facility] needed a [facility] test but he didn't want to wait 3 months for it. Dyspnea has been progressive over the last year. Dr Sherene Sires feels he has mild COPD. Previous smoker. ? Made worse with ACE and beta blockers so meds adjusted. Denies SSCP. 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 "History of MI" at that time. Denies edema, PND, orthopnea or signs of CHF. No palpitations or syncope.  Reviewed records in [place]. Had a totally normal right and left heart cath by Dr Jacinto Halim in June of 2010  F/U nuclear showed no ischemia but significant gut attenuation made intrepretation difficult F/U Echo reviewed EF 35% abnormal septum from LBBB. Diffuse hypokinesis worse in the apex and septum  Interestingly I reviewed records form [place] in 2/12 and echo indicated EF 50-55%  ROS: Denies fever, malais, weight loss, blurry vision, decreased visual acuity, cough, sputum, SOB, hemoptysis, pleuritic pain, palpitaitons, heartburn, abdominal pain, melena, lower extremity edema, claudication, or rash.  All other systems reviewed and negative  General: Affect appropriate Healthy:  appears stated age HEENT: normal Neck supple with no adenopathy JVP normal no bruits no thyromegaly Lungs clear with no wheezing and good diaphragmatic motion Heart:  S1/S2 no murmur,rub, gallop or click PMI normal Abdomen: benighn, BS positve, no tenderness, no AAA no bruit.  No HSM or HJR Distal pulses intact with no bruits No edema Neuro non-focal Skin warm and dry No muscular weakness   Current Outpatient Prescriptions  Medication Sig Dispense Refill  . amLODipine (NORVASC) 5 MG tablet Take 5 mg by mouth daily.        Marland Kitchen aspirin 325 MG tablet Take 325 mg by mouth  daily.        Marland Kitchen atorvastatin (LIPITOR) 40 MG tablet Take 40 mg by mouth daily.        . carvedilol (COREG) 12.5 MG tablet 1/2 tab po qd       . cetirizine (ZYRTEC) 10 MG tablet Take 10 mg by mouth daily.        . citalopram (CELEXA) 40 MG tablet Take 40 mg by mouth daily.        . insulin glargine (LANTUS) 100 UNIT/ML injection Inject 33 Units into the skin at bedtime.       . insulin regular (HUMULIN R) 100 UNIT/ML injection Inject 10 Units into the skin 4 (four) times daily as needed.       . meclizine (MEDI-MECLIZINE) 25 MG tablet Take 25 mg by mouth 3 (three) times daily as needed.        . metFORMIN (GLUCOPHAGE) 850 MG tablet Take 850 mg by mouth 3 (three) times daily.        . Multiple Vitamin (MULTIVITAMIN) capsule Take 1 capsule by mouth daily.        . nitroGLYCERIN (NITROSTAT) 0.4 MG SL tablet Place 0.4 mg under the tongue every 5 (five) minutes as needed.        . NON FORMULARY Lantanoprost Both eyes qhs       . omeprazole (PRILOSEC) 40 MG capsule Take 40 mg by mouth daily.        Marland Kitchen DISCONTD: furosemide (LASIX) 20 MG tablet Take 1 tablet (20 mg  total) by mouth daily.  30 tablet  6  . DISCONTD: losartan (COZAAR) 100 MG tablet Take 100 mg by mouth daily.        Marland Kitchen DISCONTD: potassium chloride (KLOR-CON 10) 10 MEQ CR tablet Take 1 tablet (10 mEq total) by mouth daily.  30 tablet  6    Allergies  Review of patient's allergies indicates no known allergies.  Electrocardiogram:  NSR 76 LAD LBBB  Assessment and Plan

## 2011-07-01 NOTE — Assessment & Plan Note (Signed)
Cardiomyopathy improved with current meds and addition of ACE/diuretic.  Given age conservative approach indicated with no cath especially since myovue nonishemic

## 2011-07-01 NOTE — Assessment & Plan Note (Signed)
Cholesterol is at goal.  Continue current dose of statin and diet Rx.  No myalgias or side effects.  F/U  LFT's in 6 months. No results found for this basename: LDLCALC             

## 2011-07-01 NOTE — Patient Instructions (Signed)
Please follow up with Dr Eden Emms in 3 months Continue all medications as listed.

## 2011-07-01 NOTE — Assessment & Plan Note (Signed)
Related to COPD  Cardiac status stable.  BMET and BNP today.  Improved with medication changes

## 2011-10-03 ENCOUNTER — Ambulatory Visit (INDEPENDENT_AMBULATORY_CARE_PROVIDER_SITE_OTHER): Payer: Medicare PPO | Admitting: Cardiovascular Disease

## 2011-10-03 ENCOUNTER — Encounter: Payer: Self-pay | Admitting: Cardiovascular Disease

## 2011-10-03 DIAGNOSIS — E785 Hyperlipidemia, unspecified: Secondary | ICD-10-CM

## 2011-10-03 DIAGNOSIS — I447 Left bundle-branch block, unspecified: Secondary | ICD-10-CM

## 2011-10-03 DIAGNOSIS — I10 Essential (primary) hypertension: Secondary | ICD-10-CM

## 2011-10-03 DIAGNOSIS — I428 Other cardiomyopathies: Secondary | ICD-10-CM

## 2011-10-03 DIAGNOSIS — R0989 Other specified symptoms and signs involving the circulatory and respiratory systems: Secondary | ICD-10-CM

## 2011-10-03 MED ORDER — CARVEDILOL 3.125 MG PO TABS: 3.1250 mg | ORAL_TABLET | Freq: Two times a day (BID) | ORAL | Status: DC

## 2011-10-03 MED ORDER — LOSARTAN POTASSIUM 50 MG PO TABS: 50.0000 mg | ORAL_TABLET | Freq: Every day | ORAL | Status: DC

## 2011-10-03 NOTE — Assessment & Plan Note (Signed)
Stable no evidence of high grade heart block or syncope.  Dizzyness from medication which will be adjusted

## 2011-10-03 NOTE — Assessment & Plan Note (Signed)
BP too low with postural symptoms.  Decrease coreg to 3.125 bid.  Decrease Cozaar to 50 and take at night.

## 2011-10-03 NOTE — Assessment & Plan Note (Signed)
Despite decrease in EF appears euvolemic and BNP not elevated.  Dont think dyspnea related much to heart.  Cardiac rehab. F/U pulmonary for COPD

## 2011-10-03 NOTE — Progress Notes (Signed)
77 yo referred by Dr Tanya Nones for dyspnea. Unfortunatley he has multiple records at the [facility] and 3 years ago from Dr Jacinto Halim when he was at [facility] none of which I currently have. [facility] but he didn't want to wait 3 months for it. Dyspnea has been progressive over the last year. Dr Sherene Sires feels he has mild COPD. Previous smoker. ? Made worse with ACE and beta blockers so meds adjusted. Denies SSCP. 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 "History of MI" at that time. Denies edema, PND, orthopnea or signs of CHF. No palpitations or syncope.  Reviewed records in [place]. Had a totally normal right and left heart cath by Dr Jacinto Halim in June of 2010  F/U nuclear showed no ischemia but significant gut attenuation made intrepretation difficult F/U Echo reviewed EF 35% abnormal septum from LBBB. Diffuse hypokinesis worse in the apex and septum  Interestingly I reviewed records form [place] in 2/12 and echo indicated EF 50-55%  BNP only 42 on 07/01/11  Dizzy in am after taking pills. With some orthostasis.    ROS: Denies fever, malais, weight loss, blurry vision, decreased visual acuity, cough, sputum, SOB, hemoptysis, pleuritic pain, palpitaitons, heartburn, abdominal pain, melena, lower extremity edema, claudication, or rash.  All other systems reviewed and negative  General: Affect appropriate Healthy:  appears stated age HEENT: normal Neck supple with no adenopathy JVP normal no bruits no thyromegaly Lungs clear with no wheezing and good diaphragmatic motion Heart:  S1/S2 SEM  murmur,rub, gallop or click PMI normal Abdomen: benighn, BS positve, no tenderness, no AAA no bruit.  No HSM or HJR Distal pulses intact with no bruits No edema Neuro non-focal Skin warm and dry No muscular weakness   Current Outpatient Prescriptions  Medication Sig Dispense Refill  . aspirin 325 MG tablet Take 325  mg by mouth daily.        Marland Kitchen atorvastatin (LIPITOR) 40 MG tablet Take 40 mg by mouth daily.        . carvedilol (COREG) 12.5 MG tablet 1/2 tab po qd       . cetirizine (ZYRTEC) 10 MG tablet Take 10 mg by mouth daily.        . citalopram (CELEXA) 40 MG tablet Take 40 mg by mouth daily.        . insulin glargine (LANTUS) 100 UNIT/ML injection Inject 33 Units into the skin at bedtime.       . insulin regular (HUMULIN R) 100 UNIT/ML injection Inject 10 Units into the skin 4 (four) times daily as needed.       Marland Kitchen losartan (COZAAR) 100 MG tablet Take 100 mg by mouth daily.        . meclizine (MEDI-MECLIZINE) 25 MG tablet Take 25 mg by mouth 3 (three) times daily as needed.        . metFORMIN (GLUCOPHAGE) 850 MG tablet Take 850 mg by mouth 3 (three) times daily.        . Multiple Vitamin (MULTIVITAMIN) capsule Take 1 capsule by mouth daily.        . nitroGLYCERIN (NITROSTAT) 0.4 MG SL tablet Place 0.4 mg under the tongue every 5 (five) minutes as needed.        . NON FORMULARY Lantanoprost Both eyes qhs       . omeprazole (PRILOSEC) 40 MG capsule Take 40 mg by mouth daily.  Allergies  Review of patient's allergies indicates no known allergies.  Electrocardiogram:  Assessment and Plan

## 2011-10-03 NOTE — Assessment & Plan Note (Signed)
Cholesterol is at goal.  Continue current dose of statin and diet Rx.  No myalgias or side effects.  F/U  LFT's in 6 months. No results found for this basename: LDLCALC             

## 2011-10-03 NOTE — Patient Instructions (Addendum)
Your physician wants you to follow-up in: 3 MONTHS WITH DR Haywood Filler will receive a reminder letter in the mail two months in advance. If you don't receive a letter, please call our office to schedule the follow-up appointment.  Your physician has recommended you make the following change in your medication: DECREASE CARVEDILOL TO 3.125 MG  TWICE DAILY AND LOSARTAN TO 50 MG EVERY DAY .  You have been referred to [facility]

## 2011-11-19 ENCOUNTER — Inpatient Hospital Stay (HOSPITAL_COMMUNITY)
Admission: EM | Admit: 2011-11-19 | Discharge: 2011-11-21 | DRG: 812 | Disposition: A | Discharge status: Home / Self Care | Payer: Medicare PPO | Attending: Internal Medicine | Admitting: Internal Medicine

## 2011-11-19 DIAGNOSIS — E785 Hyperlipidemia, unspecified: Secondary | ICD-10-CM | POA: Diagnosis present

## 2011-11-19 DIAGNOSIS — J449 Chronic obstructive pulmonary disease, unspecified: Secondary | ICD-10-CM | POA: Diagnosis present

## 2011-11-19 DIAGNOSIS — J90 Pleural effusion, not elsewhere classified: Secondary | ICD-10-CM | POA: Diagnosis present

## 2011-11-19 DIAGNOSIS — J4489 Other specified chronic obstructive pulmonary disease: Secondary | ICD-10-CM | POA: Diagnosis present

## 2011-11-19 DIAGNOSIS — E119 Type 2 diabetes mellitus without complications: Secondary | ICD-10-CM | POA: Diagnosis present

## 2011-11-19 DIAGNOSIS — H9190 Unspecified hearing loss, unspecified ear: Secondary | ICD-10-CM | POA: Diagnosis present

## 2011-11-19 DIAGNOSIS — D509 Iron deficiency anemia, unspecified: Principal | ICD-10-CM | POA: Diagnosis present

## 2011-11-19 DIAGNOSIS — F329 Major depressive disorder, single episode, unspecified: Secondary | ICD-10-CM | POA: Diagnosis present

## 2011-11-19 DIAGNOSIS — D649 Anemia, unspecified: Secondary | ICD-10-CM

## 2011-11-19 DIAGNOSIS — Z87891 Personal history of nicotine dependence: Secondary | ICD-10-CM

## 2011-11-19 DIAGNOSIS — I447 Left bundle-branch block, unspecified: Secondary | ICD-10-CM | POA: Diagnosis present

## 2011-11-19 DIAGNOSIS — F3289 Other specified depressive episodes: Secondary | ICD-10-CM | POA: Diagnosis present

## 2011-11-19 DIAGNOSIS — Z85038 Personal history of other malignant neoplasm of large intestine: Secondary | ICD-10-CM

## 2011-11-19 DIAGNOSIS — I10 Essential (primary) hypertension: Secondary | ICD-10-CM | POA: Diagnosis present

## 2011-11-19 DIAGNOSIS — Z7982 Long term (current) use of aspirin: Secondary | ICD-10-CM

## 2011-11-19 DIAGNOSIS — R0600 Dyspnea, unspecified: Secondary | ICD-10-CM | POA: Diagnosis present

## 2011-11-19 LAB — CBC
HCT: 25.5 % — ABNORMAL LOW (ref 39.0–52.0)
MCH: 18.4 pg — ABNORMAL LOW (ref 26.0–34.0)
MCHC: 28.2 g/dL — ABNORMAL LOW (ref 30.0–36.0)
MCV: 65.1 fL — ABNORMAL LOW (ref 78.0–100.0)
Platelets: 356 10*3/uL (ref 150–400)
RDW: 20.9 % — ABNORMAL HIGH (ref 11.5–15.5)

## 2011-11-19 LAB — HEPATIC FUNCTION PANEL
ALT: 13 U/L (ref 0–53)
Bilirubin, Direct: <0.1 mg/dL (ref 0.0–0.3)
Total Bilirubin: 0.2 mg/dL — ABNORMAL LOW (ref 0.3–1.2)

## 2011-11-19 LAB — POCT I-STAT, CHEM 8
BUN: 17 mg/dL (ref 6–23)
Calcium, Ion: 1.05 mmol/L — ABNORMAL LOW (ref 1.12–1.32)
Creatinine, Ser: 0.7 mg/dL (ref 0.50–1.35)
Glucose, Bld: 194 mg/dL — ABNORMAL HIGH (ref 70–99)
TCO2: 22 mmol/L (ref 0–100)

## 2011-11-19 LAB — OCCULT BLOOD, POC DEVICE: Fecal Occult Bld: NEGATIVE

## 2011-11-19 NOTE — ED Notes (Signed)
Labs redrawn by Yosselin Zoeller EMT. Lav, Lt green, blue

## 2011-11-19 NOTE — ED Provider Notes (Signed)
History     CSN: [id number] Arrival date & time: 11/19/2011  8:51 PM   First MD Initiated Contact with Patient 11/19/11 2115      Chief Complaint  Patient presents with  . Anemia    Per PTar sent by PCP for Hgb 6.0. pt had labs drawn on yesterday    (Consider location/radiation/quality/duration/timing/severity/associated sxs/prior treatment) HPI Comments: [name] is a patient at [facility], family practice has been evaluated for shortness of breath.  By a pulmonologist yesterday where he had labs drawn this evening was called to come emergently to the emergency room for severe anemia.  Patient was not given exact numbers.  He does report shortness of breath with any exertion.  This has been going on for quite some time.  His ophthalmologist started the process of evaluation because she did not like the way.  He was breathing.  Patient is not aware of any change in bowel habits.  Denies blood in stool history of anemia.  Patient is a 77 y.o. male presenting with anemia. The history is provided by the patient.  Anemia This is a new problem. The current episode started yesterday. The problem occurs constantly. The problem has been gradually worsening. Pertinent negatives include no abdominal pain, anorexia, change in bowel habit, chest pain, chills, congestion, coughing, diaphoresis, fatigue, fever, myalgias, nausea or weakness. The symptoms are aggravated by nothing. He has tried nothing for the symptoms.    Past Medical History  Diagnosis Date  . Diabetes mellitus   . Hyperlipidemia   . 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 COPD (chronic obstructive pulmonary disease)     PFT's 04/15/10  FEV1 2.50 (92%) ratio 68 DLC0 78%    No past surgical history on file.  Family History  Problem Relation Age of Onset  . Cancer Mother     lung cancer - NOT a smoker    History  Substance Use Topics  . Smoking status: Former Smoker -- 1.0  packs/day for 25 years    Types: Cigarettes  . Smokeless tobacco: Not on file  . Alcohol Use: No      Review of Systems  Constitutional: Negative for fever, chills, diaphoresis and fatigue.  HENT: Negative for congestion.   Eyes: Negative.   Respiratory: Positive for shortness of breath. Negative for cough and wheezing.   Cardiovascular: Negative for chest pain.  Gastrointestinal: Negative for nausea, abdominal pain, anal bleeding, rectal pain, anorexia and change in bowel habit.  Genitourinary: Negative for dysuria, hematuria and difficulty urinating.  Musculoskeletal: Negative for myalgias and back pain.  Skin: Positive for pallor.  Neurological: Negative for syncope, weakness and light-headedness.  Hematological: Does not bruise/bleed easily.  Psychiatric/Behavioral: Negative.     Allergies  Review of patient's allergies indicates no known allergies.  Home Medications   Current Outpatient Rx  Name Route Sig Dispense Refill  . ASPIRIN 325 MG PO TABS Oral Take 325 mg by mouth daily.      . ATORVASTATIN CALCIUM 40 MG PO TABS Oral Take 40 mg by mouth daily.      Marland Kitchen CARVEDILOL 3.125 MG PO TABS Oral Take 1 tablet (3.125 mg total) by mouth 2 (two) times daily with a meal. 60 tablet 11  . CETIRIZINE HCL 10 MG PO TABS Oral Take 10 mg by mouth daily.      Marland Kitchen CITALOPRAM HYDROBROMIDE 40 MG PO TABS Oral Take 40 mg by  mouth daily.      . INSULIN GLARGINE 100 UNIT/ML [place] SOLN Subcutaneous Inject 33 Units into the skin at bedtime.     . INSULIN REGULAR HUMAN 100 UNIT/ML IJ SOLN Subcutaneous Inject 10 Units into the skin 4 (four) times daily as needed.     Marland Kitchen LOSARTAN POTASSIUM 50 MG PO TABS Oral Take 1 tablet (50 mg total) by mouth daily.    Marland Kitchen MECLIZINE HCL 25 MG PO TABS Oral Take 25 mg by mouth 3 (three) times daily as needed.      Marland Kitchen METFORMIN HCL 850 MG PO TABS Oral Take 850 mg by mouth 3 (three) times daily.      . MULTIVITAMINS PO CAPS Oral Take 1 capsule by mouth daily.      Marland Kitchen  NITROGLYCERIN 0.4 MG SL SUBL Sublingual Place 0.4 mg under the tongue every 5 (five) minutes as needed.      . NON FORMULARY  Lantanoprost Both eyes qhs     . OMEPRAZOLE 40 MG PO CPDR Oral Take 40 mg by mouth daily.        BP 131/57  Pulse 104  Temp(Src) 98 F (36.7 C) (Oral)  Resp 20  Wt 192 lb (87.091 kg)  SpO2 95%  Physical Exam  Constitutional: He appears well-developed and well-nourished. He is not intubated.  HENT:  Head: Normocephalic.  Eyes:       Conjunctiva pale  Neck: Neck supple.  Cardiovascular: Tachycardia present.   Pulmonary/Chest: Tachypnea noted. No apnea. He is not intubated. No respiratory distress. He has no wheezes. He has no rales.  Abdominal: Soft. Bowel sounds are normal. There is no tenderness. There is no rebound.  Genitourinary: Rectum normal. Guaiac negative stool.  Musculoskeletal: Normal range of motion.  Skin: Skin is warm and dry. There is pallor.  Psychiatric: He has a normal mood and affect.    ED Course  Procedures (including critical care time)   Labs Reviewed  CBC  DIFFERENTIAL  I-STAT, CHEM 8  SAMPLE TO BLOOD BANK  POCT OCCULT BLOOD STOOL, DEVICE   No results found.   No diagnosis found.    MDM  Anemia of chronic disease GI bleed Renal disease        Arman Filter, NP 11/19/11 2152

## 2011-11-19 NOTE — ED Notes (Signed)
A rainbow is in lab on hold.

## 2011-11-20 ENCOUNTER — Encounter (HOSPITAL_COMMUNITY): Payer: Self-pay | Admitting: Family Medicine

## 2011-11-20 ENCOUNTER — Emergency Department (HOSPITAL_COMMUNITY): Payer: Medicare PPO

## 2011-11-20 DIAGNOSIS — R0600 Dyspnea, unspecified: Secondary | ICD-10-CM | POA: Diagnosis present

## 2011-11-20 DIAGNOSIS — H9190 Unspecified hearing loss, unspecified ear: Secondary | ICD-10-CM | POA: Diagnosis present

## 2011-11-20 DIAGNOSIS — D509 Iron deficiency anemia, unspecified: Secondary | ICD-10-CM | POA: Diagnosis present

## 2011-11-20 LAB — HEMOGLOBIN AND HEMATOCRIT, BLOOD
HCT: 29.2 % — ABNORMAL LOW (ref 39.0–52.0)
Hemoglobin: 8.7 g/dL — ABNORMAL LOW (ref 13.0–17.0)

## 2011-11-20 LAB — GLUCOSE, CAPILLARY
Glucose-Capillary: 175 mg/dL — ABNORMAL HIGH (ref 70–99)
Glucose-Capillary: 146 mg/dL — ABNORMAL HIGH (ref 70–99)
Glucose-Capillary: 108 mg/dL — ABNORMAL HIGH (ref 70–99)

## 2011-11-20 LAB — CBC
MCV: 66 fL — ABNORMAL LOW (ref 78.0–100.0)
Platelets: 316 10*3/uL (ref 150–400)
RDW: 21.9 % — ABNORMAL HIGH (ref 11.5–15.5)
WBC: 9.5 10*3/uL (ref 4.0–10.5)

## 2011-11-20 LAB — DIFFERENTIAL
Basophils Absolute: 0.1 10*3/uL (ref 0.0–0.1)
Lymphs Abs: 2.6 10*3/uL (ref 0.7–4.0)
Monocytes Absolute: 1.1 10*3/uL — ABNORMAL HIGH (ref 0.1–1.0)
Neutro Abs: 5.5 10*3/uL (ref 1.7–7.7)

## 2011-11-20 LAB — IRON AND TIBC: Iron: <10 ug/dL — ABNORMAL LOW (ref 42–135)

## 2011-11-20 LAB — FERRITIN: Ferritin: 3 ng/mL — ABNORMAL LOW (ref 22–322)

## 2011-11-20 LAB — AFP TUMOR MARKER: AFP-Tumor Marker: <1.3 ng/mL (ref 0.0–8.0)

## 2011-11-20 LAB — RETICULOCYTES
Retic Count, Absolute: 74.3 10*3/uL (ref 19.0–186.0)
Retic Ct Pct: 1.9 % (ref 0.4–3.1)

## 2011-11-20 LAB — VITAMIN B12: Vitamin B-12: 238 pg/mL (ref 211–911)

## 2011-11-20 LAB — BASIC METABOLIC PANEL
CO2: 24 mEq/L (ref 19–32)
Calcium: 9.1 mg/dL (ref 8.4–10.5)
Creatinine, Ser: 0.85 mg/dL (ref 0.50–1.35)
GFR calc non Af Amer: 79 mL/min — ABNORMAL LOW (ref >90)

## 2011-11-20 LAB — PREPARE RBC (CROSSMATCH)

## 2011-11-20 LAB — SAMPLE TO BLOOD BANK

## 2011-11-20 MED ORDER — SODIUM CHLORIDE 0.9 % IJ SOLN: 3.0000 mL | INTRAMUSCULAR | Status: DC | PRN

## 2011-11-20 MED ORDER — CITALOPRAM HYDROBROMIDE 40 MG PO TABS
40.0000 mg | ORAL_TABLET | Freq: Every day | ORAL | Status: DC
  Administered 2011-11-20: 40 mg via ORAL
  Filled 2011-11-20 (×3): qty 1

## 2011-11-20 MED ORDER — PANTOPRAZOLE SODIUM 40 MG PO TBEC
40.0000 mg | DELAYED_RELEASE_TABLET | Freq: Every day | ORAL | Status: DC
  Administered 2011-11-20: 40 mg via ORAL
  Filled 2011-11-20: qty 1

## 2011-11-20 MED ORDER — MECLIZINE HCL 25 MG PO TABS
25.0000 mg | ORAL_TABLET | Freq: Three times a day (TID) | ORAL | Status: DC | PRN
  Filled 2011-11-20: qty 1

## 2011-11-20 MED ORDER — ACETAMINOPHEN 325 MG PO TABS: 650.0000 mg | ORAL_TABLET | Freq: Four times a day (QID) | ORAL | Status: DC | PRN

## 2011-11-20 MED ORDER — ARTIFICIAL TEARS OP OINT
TOPICAL_OINTMENT | Freq: Once | OPHTHALMIC | Status: AC
  Administered 2011-11-20: 01:00:00 via OPHTHALMIC
  Filled 2011-11-20: qty 3.5

## 2011-11-20 MED ORDER — PEG 3350-KCL-NA BICARB-NACL 420 G PO SOLR
4000.0000 mL | Freq: Once | ORAL | Status: DC
  Filled 2011-11-20: qty 4000

## 2011-11-20 MED ORDER — HYDROCODONE-ACETAMINOPHEN 5-325 MG PO TABS: 1.0000 | ORAL_TABLET | ORAL | Status: DC | PRN

## 2011-11-20 MED ORDER — INSULIN GLARGINE 100 UNIT/ML ~~LOC~~ SOLN
20.0000 [IU] | Freq: Every day | SUBCUTANEOUS | Status: DC
  Administered 2011-11-20: 20 [IU] via SUBCUTANEOUS
  Filled 2011-11-20 (×2): qty 3

## 2011-11-20 MED ORDER — NITROGLYCERIN 0.4 MG SL SUBL: 0.4000 mg | SUBLINGUAL_TABLET | SUBLINGUAL | Status: DC | PRN

## 2011-11-20 MED ORDER — LOSARTAN POTASSIUM 50 MG PO TABS
50.0000 mg | ORAL_TABLET | Freq: Every day | ORAL | Status: DC
  Administered 2011-11-20: 50 mg via ORAL
  Filled 2011-11-20 (×3): qty 1

## 2011-11-20 MED ORDER — ALUM & MAG HYDROXIDE-SIMETH 200-200-20 MG/5ML PO SUSP: 30.0000 mL | Freq: Four times a day (QID) | ORAL | Status: DC | PRN

## 2011-11-20 MED ORDER — INSULIN ASPART 100 UNIT/ML ~~LOC~~ SOLN
0.0000 [IU] | Freq: Every day | SUBCUTANEOUS | Status: DC
  Administered 2011-11-20: 2 [IU] via SUBCUTANEOUS

## 2011-11-20 MED ORDER — CARVEDILOL 3.125 MG PO TABS
3.1250 mg | ORAL_TABLET | Freq: Two times a day (BID) | ORAL | Status: DC
  Administered 2011-11-20 (×2): 3.125 mg via ORAL
  Filled 2011-11-20 (×6): qty 1

## 2011-11-20 MED ORDER — INSULIN ASPART 100 UNIT/ML ~~LOC~~ SOLN
0.0000 [IU] | Freq: Three times a day (TID) | SUBCUTANEOUS | Status: DC
  Administered 2011-11-20: 2 [IU] via SUBCUTANEOUS
  Administered 2011-11-20: 3 [IU] via SUBCUTANEOUS
  Filled 2011-11-20: qty 3

## 2011-11-20 MED ORDER — ACETAMINOPHEN 650 MG RE SUPP: 650.0000 mg | Freq: Four times a day (QID) | RECTAL | Status: DC | PRN

## 2011-11-20 NOTE — Progress Notes (Signed)
Subjective: Patient doing well. He has no complaints. He is feeling better after getting blood. No chest pain or shortness of breath.  Objective: Weight change:   Intake/Output Summary (Last 24 hours) at 11/20/11 1217 Last data filed at 11/20/11 1024  Gross per 24 hour  Intake    378 ml  Output    500 ml  Net   -122 ml   BP 122/67  Pulse 78  Temp(Src) 98.2 F (36.8 C) (Oral)  Resp 19  Ht 6\' 1"  (1.854 m)  Wt 87.091 kg (192 lb)  BMI 25.33 kg/m2  SpO2 91% General appearance: alert, cooperative, appears stated age, no distress and mildly obese Lungs: clear to auscultation bilaterally Heart: Regular rate and rhythm, S1, S2, soft 2/6 systolic ejection murmur Abdomen: soft, non-tender; bowel sounds normal; no masses,  no organomegaly Extremities: Trace pitting edema  Lab Results: Basic Metabolic Panel:  Basename 11/20/11 0500 11/19/11 2204  NA 138 139  K 4.0 4.6  CL 102 105  CO2 24 --  GLUCOSE 198* 194*  BUN 13 17  CREATININE 0.85 0.70  CALCIUM 9.1 --  MG -- --  PHOS -- --   Liver Function Tests:  [facility] 11/19/11 2115  AST 48*  ALT 13  ALKPHOS 62  BILITOT 0.2*  PROT 6.5  ALBUMIN 3.4*   CBC:  Basename 11/20/11 0913 11/20/11 0500 11/19/11 2307  WBC -- 9.5 9.7  NEUTROABS -- -- 5.5  HGB 8.7* 7.2* --  HCT 29.2* 24.9* --  MCV -- 66.0* 65.1*  PLT -- 316 356   CBG:  Basename 11/20/11 1155 11/20/11 1022 11/20/11 0757 11/20/11 0634  GLUCAP 146* 175* 156* 165*   Anemia Panel:  Basename 11/20/11 0042  VITAMINB12 238  FOLATE >20.0  FERRITIN 3*  TIBC Not calculated due to Iron <10.  IRON <10*  RETICCTPCT 1.9   Coagulation:  Basename 11/19/11 2115  LABPROT 15.3*  INR 1.18     Micro Results: Recent Results (from the past 240 hour(s))  MRSA PCR SCREENING     Status: Normal   Collection Time   11/20/11  7:19 AM      Component Value Range Status Comment   MRSA by PCR NEGATIVE  NEGATIVE  Final     Studies/Results: Dg Chest 2 View  11/20/2011    IMPRESSION: New small bilateral pleural effusions noted; associated left basilar airspace opacity may reflect atelectasis or possibly pneumonia.  Original Report Authenticated By: Tonia Ghent, M.D.   Medications: Scheduled Meds:   . artificial tears   Both Eyes Once  . carvedilol  3.125 mg Oral BID WC  . citalopram  40 mg Oral Daily  . insulin aspart  0-15 Units Subcutaneous TID WC  . insulin aspart  0-5 Units Subcutaneous QHS  . insulin glargine  20 Units Subcutaneous Daily  . losartan  50 mg Oral Daily  . pantoprazole  40 mg Oral Q1200   Continuous Infusions:  PRN Meds:.acetaminophen, acetaminophen, alum & mag hydroxide-simeth, HYDROcodone-acetaminophen, meclizine, nitroGLYCERIN, sodium chloride  Assessment/Plan: Patient Active Hospital Problem List: Microcytic anemia (11/20/2011)  patient status post 2 unit packed red blood cells. He is feeling better. This is likely because of shortness of breath. No signs of any acute blood loss. Will discuss with GI. Meds to whether colonoscopy is indicated. Given microcytic anemia, and history of colon cancer and last colonoscopy over 15 years ago, there may be some concern for mass.  HYPERLIPIDEMIA (02/16/2010)  stable on statin.  HYPERTENSION (02/16/2010)  stable on  home meds.  COPD UNSPECIFIED (02/16/2010)  stable no flare.  LBBB (left bundle branch block) (04/29/2011)  Dyspnea (11/20/2011)  see above.  Hearing loss (11/20/2011)    LOS: 1 day   Dea Bitting K 11/20/2011, 12:17 PM

## 2011-11-20 NOTE — ED Provider Notes (Signed)
Medical screening examination/treatment/procedure(s) were performed by non-physician practitioner and as supervising physician I was immediately available for consultation/collaboration.   Lyanne Co, MD 11/20/11 Lyda Jester

## 2011-11-20 NOTE — ED Notes (Signed)
Per dr Esmeralda Arthur, patient to start receiving transfusion of blood when arrives to floor.

## 2011-11-20 NOTE — ED Notes (Signed)
Patient stable and resting in room.  Patient being admitted upstairs.  Calling report to floor.

## 2011-11-20 NOTE — H&P (Signed)
PCP:   Leo Grosser, MD, MD   Chief Complaint:  Abnormal labs   HPI: This is a 77 year old gentleman who has been having worsening shortness of breath for approximately past 4 months. He has a cardiologist and a pulmonologist no clear-cut cause was found. Today he had a routine visit with his PCP, his shortness of breath was marked with conversation, repeat blood work was done. Results showed a low hemoglobin, he was directed to the hospital. Here his hemoglobin is 7.2. The patient denies any evidence of bleeding. He states he still has been brown for the past 4 months. He denies any epigastric pain, any hematuria, any epistaxis, any hemoptysis. No chest pain, no wheezing, no presyncopal episode. His last colonoscopy was 15 years ago. He does a brother with history of colon cancer. Patient denies constipation, weight loss, anorexia. He states his weakness is due to his dyspnea. He does report some difficulty with urinating, and a history of a prostate biopsy in the distant past. He states he does occasionally have problems with his PSA levels. History obtained from patient who appears reliable. He does have a remote history of smoking. Patient does take a daily aspirin.  Review of Systems: Positives bolded The patient denies anorexia, fever, weight loss,, vision loss, decreased hearing, hoarseness, chest pain, syncope, dyspnea on exertion, peripheral edema, balance deficits, hemoptysis, abdominal pain, melena, hematochezia, severe indigestion/heartburn, hematuria, incontinence, genital sores, muscle weakness, suspicious skin lesions, transient blindness, difficulty walking, depression, unusual weight change, abnormal bleeding, enlarged lymph nodes, angioedema, and breast masses.  Past Medical History: Past Medical History  Diagnosis Date  . Diabetes mellitus   . Hyperlipidemia   . 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 COPD (chronic obstructive  pulmonary disease)     PFT's 04/15/10  FEV1 2.50 (92%) ratio 68 DLC0 78%   Past Surgical History  Procedure Date  . Cataract extraction     Medications: Prior to Admission medications   Medication Sig Start Date End Date Taking? Authorizing Provider  aspirin 325 MG tablet Take 325 mg by mouth daily.     Yes Historical Provider, MD  atorvastatin (LIPITOR) 40 MG tablet Take 40 mg by mouth daily.     Yes Historical Provider, MD  carvedilol (COREG) 3.125 MG tablet Take 1 tablet (3.125 mg total) by mouth 2 (two) times daily with a meal. 10/03/11 10/02/12 Yes Wendall Stade, MD  cetirizine (ZYRTEC) 10 MG tablet Take 10 mg by mouth daily.     Yes Historical Provider, MD  citalopram (CELEXA) 40 MG tablet Take 40 mg by mouth daily.     Yes Historical Provider, MD  insulin glargine (LANTUS) 100 UNIT/ML injection Inject 40 Units into the skin daily.    Yes Historical Provider, MD  insulin regular (HUMULIN R) 100 UNIT/ML injection Inject 10 Units into the skin 4 (four) times daily as needed.    Yes Historical Provider, MD  losartan (COZAAR) 50 MG tablet Take 1 tablet (50 mg total) by mouth daily. 10/03/11 10/02/12 Yes Wendall Stade, MD  metFORMIN (GLUCOPHAGE) 850 MG tablet Take 850 mg by mouth 3 (three) times daily.     Yes Historical Provider, MD  omeprazole (PRILOSEC) 40 MG capsule Take 40 mg by mouth daily.     Yes Historical Provider, MD  meclizine (MEDI-MECLIZINE) 25 MG tablet Take 25 mg by mouth 3 (three) times daily as needed.  Historical Provider, MD  nitroGLYCERIN (NITROSTAT) 0.4 MG SL tablet Place 0.4 mg under the tongue every 5 (five) minutes as needed.      Historical Provider, MD    Allergies:  No Known Allergies  Social History:  reports that he has quit smoking. His smoking use included Cigarettes. He has a 25 pack-year smoking history. He does not have any smokeless tobacco history on file. He reports that he does not drink alcohol. His drug history not on file.  Family  History: Family History  Problem Relation Age of Onset  . Colon cancer Brother     lung cancer - NOT a smoker    Physical Exam: Filed Vitals:   11/19/11 2230 11/19/11 2245 11/19/11 2315 11/19/11 2334  BP:   131/68 127/69  Pulse: 87 87  91  Temp:    98.7 F (37.1 C)  TempSrc:    Oral  Resp:    18  Weight:      SpO2: 95% 96%  95%    General:  Alert and oriented times three, well developed and nourished, short of breath Eyes: PERRLA, pink conjunctiva, no scleral icterus ENT: Moist oral mucosa, neck supple, no thyromegaly Lungs: clear to ascultation, no wheeze, no crackles, no use of accessory muscles Cardiovascular: regular rate and rhythm, no regurgitation, no gallops, no murmurs. No carotid bruits, no JVD Abdomen: soft, positive BS, non-tender, non-distended, no organomegaly, not an acute abdomen GU: not examined Neuro: CN II - XII grossly intact, sensation intact Musculoskeletal: strength 5/5 all extremities, no clubbing, cyanosis or edema Skin: no rash, no subcutaneous crepitation, no decubitus Psych: appropriate patient   Labs on Admission:   [facility] 11/19/11 2204  NA 139  K 4.6  CL 105  CO2 --  GLUCOSE 194*  BUN 17  CREATININE 0.70  CALCIUM --  MG --  PHOS --    Basename 11/19/11 2115  AST 48*  ALT 13  ALKPHOS 62  BILITOT 0.2*  PROT 6.5  ALBUMIN 3.4*   No results found for this basename: LIPASE:2,AMYLASE:2 in the last 72 hours  Basename 11/19/11 2307 11/19/11 2204  WBC 9.7 --  NEUTROABS 5.5 --  HGB 7.2* 8.2*  HCT 25.5* 24.0*  MCV 65.1* --  PLT 356 --   No results found for this basename: CKTOTAL:3,CKMB:3,CKMBINDEX:3,TROPONINI:3 in the last 72 hours No results found for this basename: TSH,T4TOTAL,FREET3,T3FREE,THYROIDAB in the last 72 hours  Basename 11/20/11 0042  VITAMINB12 --  FOLATE --  FERRITIN --  TIBC --  IRON --  RETICCTPCT 1.9    Radiological Exams on Admission: Dg Chest 2 View  11/20/2011  *RADIOLOGY REPORT*  Clinical Data:  Shortness of breath; admission chest radiograph.  CHEST - 2 VIEW  Comparison: Chest radiograph performed 02/16/2010  Findings: The lungs are well-aerated.  Small bilateral pleural effusions are noted; associated left basilar opacity may reflect atelectasis or possibly pneumonia.  There is no evidence of pneumothorax.  The heart is borderline normal in size; calcification is noted within the aortic arch.  No acute osseous abnormalities are seen.  IMPRESSION: New small bilateral pleural effusions noted; associated left basilar airspace opacity may reflect atelectasis or possibly pneumonia.  Original Report Authenticated By: Tonia Ghent, M.D.    Assessment/Plan Present on Admission:  . profound symptomatic Anemia .Dyspnea Admit to step down  No evidence of bleeding 2 units transfusion other than the ER, posttransfusion H&H ordered. Additional transfusion per a.m. team  Consult GI in a.m. as colonoscopy is needed, clear liquid diet  Tumor markers ordered  Anemia panel ordered in the ER  Continue PPI daily   .COPD UNSPECIFIED .HYPERLIPIDEMIA .HYPERTENSION .LBBB (left bundle branch block) Stable  Resume home medications, aspirin discontinued Lantus continued at a lower dose as patient is on a clear liquid diet   Full code  SCDs DVT prophylaxis Team 6/Dr. Art Buff, [place] 11/20/2011, 2:39 AM

## 2011-11-21 LAB — TYPE AND SCREEN
Unit division: 0
Unit division: 0

## 2011-11-21 LAB — GLUCOSE, CAPILLARY

## 2011-11-21 MED ORDER — CITALOPRAM HYDROBROMIDE 40 MG PO TABS: 20.0000 mg | ORAL_TABLET | Freq: Every day | ORAL | Status: DC

## 2011-11-21 NOTE — Progress Notes (Signed)
Utilization review completed.  

## 2011-11-21 NOTE — Progress Notes (Signed)
D/C instructions given and discussed with pt. Pt has no questions. Pt D/C home.

## 2011-11-21 NOTE — Discharge Summary (Signed)
DISCHARGE SUMMARY  Adin Laker  MR#: [id number]  DOB:06-Nov-1927  Date of Admission: 11/19/2011 Date of Discharge: 11/21/2011  Attending Physician:Janie Capp K  Patient's NWG:[id number],QMVHQI TOM, MD, MD  Consults:None, pt declined a GI consultation.  Discharge Diagnoses: Present on Admission:  .Microcytic anemia .Dyspnea .COPD UNSPECIFIED .HYPERLIPIDEMIA .HYPERTENSION .LBBB (left bundle branch block) .Hearing loss    Current Discharge Medication List    CONTINUE these medications which have CHANGED   Details  citalopram (CELEXA) 40 MG tablet Take 0.5 tablets (20 mg total) by mouth daily. Qty: 30 tablet, Refills: 0      CONTINUE these medications which have NOT CHANGED   Details  atorvastatin (LIPITOR) 40 MG tablet Take 40 mg by mouth daily.      brimonidine (ALPHAGAN) 0.2 % ophthalmic solution Place 1 drop into both eyes 3 (three) times daily.      carvedilol (COREG) 3.125 MG tablet Take 1 tablet (3.125 mg total) by mouth 2 (two) times daily with a meal. Qty: 60 tablet, Refills: 11    cetirizine (ZYRTEC) 10 MG tablet Take 10 mg by mouth daily.      insulin glargine (LANTUS) 100 UNIT/ML injection Inject 40 Units into the skin daily.     insulin regular (HUMULIN R) 100 UNIT/ML injection Inject 10 Units into the skin 4 (four) times daily as needed.     losartan (COZAAR) 50 MG tablet Take 1 tablet (50 mg total) by mouth daily.    metFORMIN (GLUCOPHAGE) 850 MG tablet Take 850 mg by mouth 3 (three) times daily.      omeprazole (PRILOSEC) 40 MG capsule Take 40 mg by mouth daily.      meclizine (MEDI-MECLIZINE) 25 MG tablet Take 25 mg by mouth 3 (three) times daily as needed.      nitroGLYCERIN (NITROSTAT) 0.4 MG SL tablet Place 0.4 mg under the tongue every 5 (five) minutes as needed.        STOP taking these medications     aspirin 325 MG tablet           Hospital Course: Present on Admission:  .Microcytic anemia: Patient is [place] 77 year old white male  with past medical history of COPD, and a brother, who had colon cancer who presented with weakness and shortness of breath, at his PCPs office. Lab studies were noted. The patient had a hemoglobin of 7. He was sent over to the hospital for evaluation and treatment. Patient on arrival was transfused 2 units packed red blood cells. The following morning. His hemoglobin was up to 8. After discussion with the patient, the plan would be for GI to see. The patient in the hospital and proceed with colonoscopy and if needed. EGD. The patient presented had a colonoscopy done approximately 15 years ago. He was noted to have an MCV of 65 and his blood pressure and heart were stable, alluding that this was likely not an immediate severe GI bleed, and they have been more of a slow bleed. Certainly with a brother who had a history of colon cancer, there would be concerned to check for this.  Patient remained stable and on clear liquids. During the day in preparation for a colonic prep and C. scope EGD. The following day. Unfortunately, the patient became aggravated at his care. He seemed to be quite angry that he was not being about to eat. He became angry that the nurses on the floor. Did not seem to know what his course would be and he was upset already about his  weight in the emergency room. He spoke with me on the evening of hospital day one and stated that the next morning. He wanted to be discharged without test. I asked him what needed to be done that would get him to stay for his so. He said absolutely nothing. We are able to compromise in that he promised he would follow up with his PCP and schedule an outpatient scope if his PCP felt it was necessary.  I'm discharging him today. Although this is against my medical advice. I'm telling him that he needs to immediately stop his aspirin for now until his scope.   Marland KitchenDyspnea improved after blood transfusion.  Marland KitchenCOPD UNSPECIFIED stable since admission.  Marland KitchenHYPERLIPIDEMIA,  stable during this admission.  Marland KitchenHYPERTENSION: Even stable. Prior to blood transfusion.  Marland KitchenLBBB (left bundle branch block): Stable. During this admission.  Marland KitchenHearing loss: Chronic  Depression: Patient is on Celexa 40 mg. I decreased his to 20 mg, S. pharmacy recommended inpatient sore over 62, decreased to a lower dose to prevent QT prolongation.   Day of Discharge BP 132/67  Pulse 95  Temp(Src) 97.4 F (36.3 C) (Oral)  Resp 19  Ht 6\' 1"  (1.854 m)  Wt 87.091 kg (192 lb)  BMI 25.33 kg/m2  SpO2 94%  Physical Exam: Patient deferred his exam. He wants to be discharged immediately.  Results for orders placed during the hospital encounter of 11/19/11 (from the past 24 hour(s))  GLUCOSE, CAPILLARY     Status: Abnormal   Collection Time   11/20/11  7:57 AM      Component Value Range   Glucose-Capillary 156 (*) 70 - 99 (mg/dL)   Comment 1 Notify RN    HEMOGLOBIN AND HEMATOCRIT, BLOOD     Status: Abnormal   Collection Time   11/20/11  9:13 AM      Component Value Range   Hemoglobin 8.7 (*) 13.0 - 17.0 (g/dL)   HCT 45.4 (*) 09.8 - 52.0 (%)  GLUCOSE, CAPILLARY     Status: Abnormal   Collection Time   11/20/11 10:22 AM      Component Value Range   Glucose-Capillary 175 (*) 70 - 99 (mg/dL)   Comment 1 Notify RN    GLUCOSE, CAPILLARY     Status: Abnormal   Collection Time   11/20/11 11:55 AM      Component Value Range   Glucose-Capillary 146 (*) 70 - 99 (mg/dL)   Comment 1 Notify RN    GLUCOSE, CAPILLARY     Status: Abnormal   Collection Time   11/20/11  5:23 PM      Component Value Range   Glucose-Capillary 108 (*) 70 - 99 (mg/dL)   Comment 1 Documented in Chart     Comment 2 Notify RN    GLUCOSE, CAPILLARY     Status: Abnormal   Collection Time   11/20/11 10:38 PM      Component Value Range   Glucose-Capillary 223 (*) 70 - 99 (mg/dL)   Comment 1 Documented in Chart     Comment 2 Notify RN    GLUCOSE, CAPILLARY     Status: Abnormal   Collection Time   11/21/11  2:30  AM      Component Value Range   Glucose-Capillary 210 (*) 70 - 99 (mg/dL)   Comment 1 Notify RN     Comment 2 Documented in Chart      Disposition: Improved in that he is a blood transfusion. We're not sure because of his  anemia.   Follow-up Appts: Discharge Orders    Future Orders Please Complete By Expires   Diet - low sodium heart healthy      Increase activity slowly         Follow-up with Dr. Tanya Nones, PCP in 1-2 days.   Tests Needing Follow-up: Patient is a colonoscopy and possible EGD  Signed: Hollice Espy 11/21/2011, 7:35 AM

## 2011-11-23 ENCOUNTER — Encounter (HOSPITAL_COMMUNITY): Payer: Self-pay | Admitting: Emergency Medicine

## 2011-12-16 ENCOUNTER — Ambulatory Visit (HOSPITAL_COMMUNITY)
Admission: RE | Admit: 2011-12-16 | Discharge: 2011-12-16 | Disposition: A | Discharge status: Home / Self Care | Payer: Medicare PPO | Source: Ambulatory Visit | Attending: Gastroenterology | Admitting: Gastroenterology

## 2011-12-16 ENCOUNTER — Encounter (HOSPITAL_COMMUNITY)
Admission: RE | Disposition: A | Discharge status: Home / Self Care | Payer: Self-pay | Source: Ambulatory Visit | Attending: Gastroenterology

## 2011-12-16 ENCOUNTER — Encounter (HOSPITAL_COMMUNITY): Payer: Self-pay | Admitting: *Deleted

## 2011-12-16 DIAGNOSIS — D649 Anemia, unspecified: Secondary | ICD-10-CM | POA: Insufficient documentation

## 2011-12-16 DIAGNOSIS — K5710 Diverticulosis of small intestine without perforation or abscess without bleeding: Secondary | ICD-10-CM | POA: Insufficient documentation

## 2011-12-16 DIAGNOSIS — K5521 Angiodysplasia of colon with hemorrhage: Secondary | ICD-10-CM | POA: Insufficient documentation

## 2011-12-16 HISTORY — DX: Depression, unspecified: F32.A

## 2011-12-16 HISTORY — DX: Reserved for inherently not codable concepts without codable children: IMO0001

## 2011-12-16 HISTORY — DX: Anemia, unspecified: D64.9

## 2011-12-16 HISTORY — DX: Encounter for other specified aftercare: Z51.89

## 2011-12-16 HISTORY — DX: Cardiac arrhythmia, unspecified: I49.9

## 2011-12-16 HISTORY — DX: Shortness of breath: R06.02

## 2011-12-16 HISTORY — DX: Major depressive disorder, single episode, unspecified: F32.9

## 2011-12-16 HISTORY — PX: ENTEROSCOPY: SHX5533

## 2011-12-16 HISTORY — DX: Heart failure, unspecified: I50.9

## 2011-12-16 HISTORY — DX: Gastro-esophageal reflux disease without esophagitis: K21.9

## 2011-12-16 HISTORY — DX: Atherosclerotic heart disease of native coronary artery without angina pectoris: I25.10

## 2011-12-16 HISTORY — DX: Peripheral vascular disease, unspecified: I73.9

## 2011-12-16 LAB — PREPARE RBC (CROSSMATCH)

## 2011-12-16 LAB — TYPE AND SCREEN

## 2011-12-16 LAB — GLUCOSE, CAPILLARY: Glucose-Capillary: 114 mg/dL — ABNORMAL HIGH (ref 70–99)

## 2011-12-16 LAB — CBC
MCH: 20.4 pg — ABNORMAL LOW (ref 26.0–34.0)
MCHC: 29.5 g/dL — ABNORMAL LOW (ref 30.0–36.0)
Platelets: 255 10*3/uL (ref 150–400)

## 2011-12-16 SURGERY — ENTEROSCOPY
Anesthesia: Moderate Sedation

## 2011-12-16 MED ORDER — FENTANYL CITRATE 0.05 MG/ML IJ SOLN
INTRAMUSCULAR | Status: DC | PRN
  Administered 2011-12-16: 10 ug via INTRAVENOUS
  Administered 2011-12-16: 25 ug via INTRAVENOUS
  Administered 2011-12-16: 10 ug via INTRAVENOUS

## 2011-12-16 MED ORDER — MIDAZOLAM HCL 10 MG/2ML IJ SOLN
INTRAMUSCULAR | Status: AC
  Filled 2011-12-16: qty 2

## 2011-12-16 MED ORDER — GLUCAGON HCL (RDNA) 1 MG IJ SOLR
INTRAMUSCULAR | Status: DC | PRN
  Administered 2011-12-16: 1 mg via INTRAVENOUS

## 2011-12-16 MED ORDER — FENTANYL CITRATE 0.05 MG/ML IJ SOLN
INTRAMUSCULAR | Status: AC
  Filled 2011-12-16: qty 2

## 2011-12-16 MED ORDER — MIDAZOLAM HCL 10 MG/2ML IJ SOLN
INTRAMUSCULAR | Status: DC | PRN
  Administered 2011-12-16: 2 mg via INTRAVENOUS
  Administered 2011-12-16 (×2): 1 mg via INTRAVENOUS

## 2011-12-16 MED ORDER — SODIUM CHLORIDE 0.9 % IV SOLN
INTRAVENOUS | Status: DC
  Administered 2011-12-16: 500 mL via INTRAVENOUS

## 2011-12-16 MED ORDER — BUTAMBEN-TETRACAINE-BENZOCAINE 2-2-14 % EX AERO
INHALATION_SPRAY | CUTANEOUS | Status: DC | PRN
  Administered 2011-12-16: 2 via TOPICAL

## 2011-12-16 NOTE — Op Note (Signed)
[facility] [address] [place], [place]  [address]  OPERATIVE PROCEDURE REPORT  PATIENT:  Hunter Howard, Hunter Howard  MR#:  [id number] BIRTHDATE:  01-22-1927, 84 yrs. old  GENDER:  male ENDOSCOPIST:  Jeani Hawking, MD PROCEDURE DATE:  12/16/2011 PROCEDURE:  Small bowel endoscopy with control of bleeding ASA CLASS:  Class III INDICATIONS:  Anemia and Gastric AVM MEDICATIONS:   Fentanyl 45 mcg IV, Versed 4 mg IV, glucagon 1 mg IV  DESCRIPTION OF PROCEDURE:   After the risks benefits and alternatives of the procedure were thoroughly explained, informed consent was obtained.  The 110230 endoscope was introduced through the mouth and advanced to the second portion of the duodenum, without limitations.  The instrument was slowly withdrawn as the mucosa was fully examined. <<PROCEDUREIMAGES>>  FINDINGS:  Deep intubation of the small bowel was achieved. The pediatric colonoscope was able to be advanced into the proximal jejunum. On the second examination of the proximal jejunum/distal duodenum two nonbleeding AVMs were identified. APC was applied to the area successfully. No other AVMs were noted with careful examination of the small bowel. The prior AVM identified during the recent EGD was again found in the fundic region. APC was applied and all bleeding from this site was arrested. Retroflexed views revealed no abnormalities.    The scope was then withdrawn from the patient and the procedure terminated.  COMPLICATIONS:  None  ENDOSCOPIC IMPRESSION: 1) Gastric and small bowel AVMs. 2) Duodenal diverticulum. RECOMMENDATIONS: 1) Check CBC today and repeat CBC in 2-4 weeks. 2) Iron supplementation.  ______________________________ Jeani Hawking, MD  n. Rosalie DoctorJeani Hawking at 12/16/2011 08:47 AM  Pamelia Hoit, [phone]

## 2011-12-16 NOTE — Progress Notes (Signed)
Had 2 units PRBC no s/s of blood transfusion reaction noted.

## 2011-12-16 NOTE — H&P (View-Only) (Signed)
Utilization review completed.  

## 2011-12-16 NOTE — Interval H&P Note (Signed)
History and Physical Interval Note:  12/16/2011 8:09 AM  Hunter Howard  has presented today for surgery, with the diagnosis of AVM  The various methods of treatment have been discussed with the patient and family. After consideration of risks, benefits and other options for treatment, the patient has consented to  Procedure(s): ENTEROSCOPY as a surgical intervention .  The patients' history has been reviewed, patient examined, no change in status, stable for surgery.  I have reviewed the patients' chart and labs.  Questions were answered to the patient's satisfaction.     Margorie Renner D

## 2011-12-17 LAB — TYPE AND SCREEN

## 2011-12-21 ENCOUNTER — Encounter (HOSPITAL_COMMUNITY): Payer: Self-pay | Admitting: Gastroenterology

## 2012-01-27 ENCOUNTER — Other Ambulatory Visit: Payer: Self-pay | Admitting: Family Medicine

## 2012-01-27 DIAGNOSIS — J984 Other disorders of lung: Secondary | ICD-10-CM

## 2012-01-31 ENCOUNTER — Other Ambulatory Visit: Payer: Medicare PPO

## 2012-02-02 ENCOUNTER — Ambulatory Visit
Admission: RE | Admit: 2012-02-02 | Discharge: 2012-02-02 | Disposition: A | Discharge status: Home / Self Care | Payer: Medicare PPO | Source: Ambulatory Visit | Attending: Family Medicine | Admitting: Family Medicine

## 2012-02-02 DIAGNOSIS — J984 Other disorders of lung: Secondary | ICD-10-CM

## 2012-02-07 ENCOUNTER — Other Ambulatory Visit: Payer: Self-pay | Admitting: Family Medicine

## 2012-02-07 DIAGNOSIS — R918 Other nonspecific abnormal finding of lung field: Secondary | ICD-10-CM

## 2012-03-06 ENCOUNTER — Inpatient Hospital Stay
Admission: RE | Admit: 2012-03-06 | Discharge: 2012-03-06 | Payer: Medicare PPO | Source: Ambulatory Visit | Attending: Family Medicine | Admitting: Family Medicine

## 2012-03-13 ENCOUNTER — Ambulatory Visit
Admission: RE | Admit: 2012-03-13 | Discharge: 2012-03-13 | Disposition: A | Discharge status: Home / Self Care | Payer: Medicare PPO | Source: Ambulatory Visit | Attending: Family Medicine | Admitting: Family Medicine

## 2012-03-13 DIAGNOSIS — R918 Other nonspecific abnormal finding of lung field: Secondary | ICD-10-CM

## 2012-03-13 MED ORDER — IOHEXOL 300 MG/ML  SOLN
75.0000 mL | Freq: Once | INTRAMUSCULAR | Status: AC | PRN
  Administered 2012-03-13: 75 mL via INTRAVENOUS

## 2012-05-03 ENCOUNTER — Emergency Department (HOSPITAL_COMMUNITY): Payer: Medicare PPO

## 2012-05-03 ENCOUNTER — Inpatient Hospital Stay (HOSPITAL_COMMUNITY)
Admission: EM | Admit: 2012-05-03 | Discharge: 2012-05-08 | DRG: 246 | Disposition: A | Discharge status: Home / Self Care | Payer: Medicare PPO | Attending: Internal Medicine | Admitting: Internal Medicine

## 2012-05-03 ENCOUNTER — Encounter (HOSPITAL_COMMUNITY): Payer: Self-pay

## 2012-05-03 DIAGNOSIS — J9601 Acute respiratory failure with hypoxia: Secondary | ICD-10-CM

## 2012-05-03 DIAGNOSIS — E119 Type 2 diabetes mellitus without complications: Secondary | ICD-10-CM | POA: Diagnosis present

## 2012-05-03 DIAGNOSIS — K219 Gastro-esophageal reflux disease without esophagitis: Secondary | ICD-10-CM | POA: Diagnosis present

## 2012-05-03 DIAGNOSIS — R222 Localized swelling, mass and lump, trunk: Secondary | ICD-10-CM | POA: Diagnosis present

## 2012-05-03 DIAGNOSIS — Z7902 Long term (current) use of antithrombotics/antiplatelets: Secondary | ICD-10-CM

## 2012-05-03 DIAGNOSIS — R918 Other nonspecific abnormal finding of lung field: Secondary | ICD-10-CM

## 2012-05-03 DIAGNOSIS — I10 Essential (primary) hypertension: Secondary | ICD-10-CM | POA: Diagnosis present

## 2012-05-03 DIAGNOSIS — I509 Heart failure, unspecified: Secondary | ICD-10-CM

## 2012-05-03 DIAGNOSIS — I2589 Other forms of chronic ischemic heart disease: Secondary | ICD-10-CM | POA: Diagnosis present

## 2012-05-03 DIAGNOSIS — I5023 Acute on chronic systolic (congestive) heart failure: Secondary | ICD-10-CM | POA: Diagnosis present

## 2012-05-03 DIAGNOSIS — E872 Acidosis, unspecified: Secondary | ICD-10-CM | POA: Diagnosis present

## 2012-05-03 DIAGNOSIS — I739 Peripheral vascular disease, unspecified: Secondary | ICD-10-CM | POA: Diagnosis present

## 2012-05-03 DIAGNOSIS — I2510 Atherosclerotic heart disease of native coronary artery without angina pectoris: Secondary | ICD-10-CM | POA: Diagnosis present

## 2012-05-03 DIAGNOSIS — C3430 Malignant neoplasm of lower lobe, unspecified bronchus or lung: Secondary | ICD-10-CM | POA: Diagnosis present

## 2012-05-03 DIAGNOSIS — Z9849 Cataract extraction status, unspecified eye: Secondary | ICD-10-CM

## 2012-05-03 DIAGNOSIS — R0902 Hypoxemia: Secondary | ICD-10-CM | POA: Diagnosis present

## 2012-05-03 DIAGNOSIS — F3289 Other specified depressive episodes: Secondary | ICD-10-CM | POA: Diagnosis present

## 2012-05-03 DIAGNOSIS — D509 Iron deficiency anemia, unspecified: Secondary | ICD-10-CM

## 2012-05-03 DIAGNOSIS — J449 Chronic obstructive pulmonary disease, unspecified: Secondary | ICD-10-CM

## 2012-05-03 DIAGNOSIS — Z794 Long term (current) use of insulin: Secondary | ICD-10-CM

## 2012-05-03 DIAGNOSIS — Z9861 Coronary angioplasty status: Secondary | ICD-10-CM

## 2012-05-03 DIAGNOSIS — J4489 Other specified chronic obstructive pulmonary disease: Secondary | ICD-10-CM | POA: Diagnosis present

## 2012-05-03 DIAGNOSIS — Z79899 Other long term (current) drug therapy: Secondary | ICD-10-CM

## 2012-05-03 DIAGNOSIS — D649 Anemia, unspecified: Secondary | ICD-10-CM | POA: Diagnosis present

## 2012-05-03 DIAGNOSIS — R0609 Other forms of dyspnea: Secondary | ICD-10-CM

## 2012-05-03 DIAGNOSIS — I447 Left bundle-branch block, unspecified: Secondary | ICD-10-CM | POA: Diagnosis present

## 2012-05-03 DIAGNOSIS — R0600 Dyspnea, unspecified: Secondary | ICD-10-CM

## 2012-05-03 DIAGNOSIS — E785 Hyperlipidemia, unspecified: Secondary | ICD-10-CM | POA: Diagnosis present

## 2012-05-03 DIAGNOSIS — F329 Major depressive disorder, single episode, unspecified: Secondary | ICD-10-CM | POA: Diagnosis present

## 2012-05-03 DIAGNOSIS — R0602 Shortness of breath: Secondary | ICD-10-CM

## 2012-05-03 DIAGNOSIS — J9600 Acute respiratory failure, unspecified whether with hypoxia or hypercapnia: Secondary | ICD-10-CM | POA: Diagnosis present

## 2012-05-03 DIAGNOSIS — H9190 Unspecified hearing loss, unspecified ear: Secondary | ICD-10-CM | POA: Diagnosis present

## 2012-05-03 LAB — COMPREHENSIVE METABOLIC PANEL
ALT: 17 U/L (ref 0–53)
Alkaline Phosphatase: 82 U/L (ref 39–117)
BUN: 20 mg/dL (ref 6–23)
CO2: 18 mEq/L — ABNORMAL LOW (ref 19–32)
GFR calc Af Amer: 87 mL/min — ABNORMAL LOW (ref >90)
GFR calc non Af Amer: 75 mL/min — ABNORMAL LOW (ref >90)
Glucose, Bld: 281 mg/dL — ABNORMAL HIGH (ref 70–99)
Potassium: 4.4 mEq/L (ref 3.5–5.1)
Sodium: 136 mEq/L (ref 135–145)
Total Bilirubin: 0.4 mg/dL (ref 0.3–1.2)
Total Protein: 7.3 g/dL (ref 6.0–8.3)

## 2012-05-03 LAB — CBC
Hemoglobin: 15.7 g/dL (ref 13.0–17.0)
MCH: 31.7 pg (ref 26.0–34.0)
MCV: 90.1 fL (ref 78.0–100.0)
RBC: 4.96 MIL/uL (ref 4.22–5.81)
WBC: 11.1 10*3/uL — ABNORMAL HIGH (ref 4.0–10.5)

## 2012-05-03 LAB — POCT I-STAT 3, ART BLOOD GAS (G3+)
Acid-base deficit: 5 mmol/L — ABNORMAL HIGH (ref 0.0–2.0)
O2 Saturation: 97 %
Patient temperature: 98.6
TCO2: 21 mmol/L (ref 0–100)
pCO2 arterial: 34.4 mmHg — ABNORMAL LOW (ref 35.0–45.0)

## 2012-05-03 LAB — DIFFERENTIAL
Eosinophils Absolute: 0.5 10*3/uL (ref 0.0–0.7)
Lymphocytes Relative: 24 % (ref 12–46)
Lymphs Abs: 2.7 10*3/uL (ref 0.7–4.0)
Monocytes Relative: 7 % (ref 3–12)
Neutrophils Relative %: 64 % (ref 43–77)

## 2012-05-03 LAB — TROPONIN I: Troponin I: <0.3 ng/mL (ref <0.30)

## 2012-05-03 MED ORDER — NITROGLYCERIN 2 % TD OINT
1.0000 [in_us] | TOPICAL_OINTMENT | Freq: Once | TRANSDERMAL | Status: AC
  Administered 2012-05-03: 1 [in_us] via TOPICAL
  Filled 2012-05-03: qty 1

## 2012-05-03 MED ORDER — FUROSEMIDE 10 MG/ML IJ SOLN
40.0000 mg | Freq: Once | INTRAMUSCULAR | Status: AC
  Administered 2012-05-03: 19:00:00 via INTRAVENOUS
  Administered 2012-05-03: 40 mg via INTRAVENOUS
  Filled 2012-05-03: qty 4

## 2012-05-03 MED ORDER — FUROSEMIDE 10 MG/ML IJ SOLN
INTRAMUSCULAR | Status: AC
  Administered 2012-05-03: 19:00:00
  Filled 2012-05-03: qty 4

## 2012-05-03 MED ORDER — FUROSEMIDE 10 MG/ML IJ SOLN
INTRAMUSCULAR | Status: AC
  Filled 2012-05-03: qty 2

## 2012-05-03 NOTE — ED Notes (Signed)
Per EMS pt c/o sudden onset of SOB, pt was on the phone w/an insurance adjuster became upset and developed sob approx 1800 tonight. Pt denies abd/chest/back pain. Pt was pale, rr distress and diaphoretic upon EMS arrival. Pt received 20 g LAC, Lasix 50 mg, and pt took Nitro x1 approx 1820, EKB shows LBBB

## 2012-05-03 NOTE — ED Notes (Signed)
Pt reports low back pain and intermittent episodes of diarrhea x3-4 days. Pt denies chest/abd pain, N/V,  Dizziness, or chest/nasal congestion. Pt reports sudden onset of SOB after having a disagreement w/his insurance adjuster this evening, per daughter at bedside she noticed pt becoming more SOB x2 days.

## 2012-05-03 NOTE — ED Notes (Signed)
Pt c/o lower back pain and intermittent diarrhea x4 days. Pt denies chest/abd [

## 2012-05-03 NOTE — Consult Note (Addendum)
Primary Cardiologist: Eden Emms  Reason for Consultation: Shortness of Breath  HPI: 78 year old white male with a past medical history significant for left ventricular dysfunction with an ejection fraction of 35% (unclear ischemic versus nonischemic), hypertension, hyperlipidemia, chronic left bundle branch block, COPD, left lower lobe lung mass measuring 3 cm by CT scan who presents to the emergency department for evaluation of shortness of breath. The patient was in his usual state of health until today when he was on the phone and had upsetting phone call with an [employer]. Shortly after this, he developed severe shortness of breath. Emergency medical services was contacted who felt that he was in heart failure. He was given Lasix 90 mg IV and started on BiPAP in the ED and was placed on nitroglycerin paste. He has clinically improved, but he is currently on 100% oxygen with saturations of 96%.  In talking with the patient. He denies any recent heart failure symptoms. He is complaining of severe shortness of breath and he cannot get enough air. He denies any chest pain. He reports his weight has been stable. He is enlarged and edema, orthopnea, or PND of the past one week.  Past Medical History  Diagnosis Date  . Diabetes mellitus   . Hyperlipidemia   . 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 COPD (chronic obstructive pulmonary disease)     PFT's 04/15/10  FEV1 2.50 (92%) ratio 68 DLC0 78%  . Shortness of breath   . Blood transfusion   . GERD (gastroesophageal reflux disease)   . Cancer     skin  . Coronary artery disease   . CHF (congestive heart failure)   . Dysrhythmia   . Peripheral vascular disease   . Anemia   . Depression     History   Social History  . Marital Status: Married    Spouse Name: N/A    Number of Children: N/A  . Years of Education: N/A   Occupational History  . Not on file.   Social History Main Topics    . Smoking status: Former Smoker -- 1.0 packs/day for 25 years    Types: Cigarettes  . Smokeless tobacco: Not on file  . Alcohol Use: No  . Drug Use: Not on file  . Sexually Active: Yes   Other Topics Concern  . Not on file   Social History Narrative  . No narrative on file    Family History  Problem Relation Age of Onset  . Colon cancer Brother     lung cancer - NOT a smoker    Review of Systems: All systems reviewed and are negative except as mentioned above in the history of present illness.    PHYSICAL EXAM: Filed Vitals:   05/03/12 2317  BP: 110/73  Pulse: 94  Temp:   Resp: 25   GENERAL: Mild distress.   HEENT: Normocephalic, atraumatic.  Oropharynx is pink and moist without lesions.  NECK: Supple, no LAD, 7cm JVD above clavicle, no masses. CV: Regular rate and rhythm with no murmurs, rubs, or gallops.   LUNGS: Biabsilar crackles.   ABDOMEN: +BS, soft, nontender, nondistended.  EXTREMITIES: No clubbing, cyanosis, or edema.   NEURO: AO x 3, no focal deficits. PYSCH: Normal affect. SKIN: No rashes.     ECG: NSR with LBBB at 90 BPM  Results for orders placed during the hospital encounter of 05/03/12 (from the past 24 hour(s))  CBC  Status: Abnormal   Collection Time   05/03/12  7:10 PM      Component Value Range   WBC 11.1 (*) 4.0 - 10.5 (K/uL)   RBC 4.96  4.22 - 5.81 (MIL/uL)   Hemoglobin 15.7  13.0 - 17.0 (g/dL)   HCT 40.9  81.1 - 91.4 (%)   MCV 90.1  78.0 - 100.0 (fL)   MCH 31.7  26.0 - 34.0 (pg)   MCHC 35.1  30.0 - 36.0 (g/dL)   RDW 78.2 (*) 95.6 - 15.5 (%)   Platelets 200  150 - 400 (K/uL)  DIFFERENTIAL     Status: Normal   Collection Time   05/03/12  7:10 PM      Component Value Range   Neutrophils Relative 64  43 - 77 (%)   Neutro Abs 7.1  1.7 - 7.7 (K/uL)   Lymphocytes Relative 24  12 - 46 (%)   Lymphs Abs 2.7  0.7 - 4.0 (K/uL)   Monocytes Relative 7  3 - 12 (%)   Monocytes Absolute 0.8  0.1 - 1.0 (K/uL)   Eosinophils Relative 5  0 - 5  (%)   Eosinophils Absolute 0.5  0.0 - 0.7 (K/uL)   Basophils Relative 0  0 - 1 (%)   Basophils Absolute 0.0  0.0 - 0.1 (K/uL)  COMPREHENSIVE METABOLIC PANEL     Status: Abnormal   Collection Time   05/03/12  7:10 PM      Component Value Range   Sodium 136  135 - 145 (mEq/L)   Potassium 4.4  3.5 - 5.1 (mEq/L)   Chloride 102  96 - 112 (mEq/L)   CO2 18 (*) 19 - 32 (mEq/L)   Glucose, Bld 281 (*) 70 - 99 (mg/dL)   BUN 20  6 - 23 (mg/dL)   Creatinine, Ser 2.13  0.50 - 1.35 (mg/dL)   Calcium 9.3  8.4 - 08.6 (mg/dL)   Total Protein 7.3  6.0 - 8.3 (g/dL)   Albumin 3.8  3.5 - 5.2 (g/dL)   AST 26  0 - 37 (U/L)   ALT 17  0 - 53 (U/L)   Alkaline Phosphatase 82  39 - 117 (U/L)   Total Bilirubin 0.4  0.3 - 1.2 (mg/dL)   GFR calc non Af Amer 75 (*) >90 (mL/min)   GFR calc Af Amer 87 (*) >90 (mL/min)  TROPONIN I     Status: Normal   Collection Time   05/03/12  7:11 PM      Component Value Range   Troponin I <0.30  <0.30 (ng/mL)  PRO B NATRIURETIC PEPTIDE     Status: Abnormal   Collection Time   05/03/12  7:11 PM      Component Value Range   Pro B Natriuretic peptide (BNP) 1691.0 (*) 0 - 450 (pg/mL)  POCT I-STAT 3, BLOOD GAS (G3+)     Status: Abnormal   Collection Time   05/03/12  9:38 PM      Component Value Range   pH, Arterial 7.367  7.350 - 7.450    pCO2 arterial 34.4 (*) 35.0 - 45.0 (mmHg)   pO2, Arterial 93.0  80.0 - 100.0 (mmHg)   Bicarbonate 19.8 (*) 20.0 - 24.0 (mEq/L)   TCO2 21  0 - 100 (mmol/L)   O2 Saturation 97.0     Acid-base deficit 5.0 (*) 0.0 - 2.0 (mmol/L)   Patient temperature 98.6 F     Collection site RADIAL, ALLEN'S TEST ACCEPTABLE  Drawn by RT     Sample type ARTERIAL     Dg Chest [facility] 1 View  05/03/2012  *RADIOLOGY REPORT*  Clinical Data: Short of breath.  Respiratory failure.  PORTABLE CHEST - 1 VIEW  Comparison: 03/13/2012.  Findings: Monitoring leads are projected over the chest.  Perihilar and basilar predominant airspace disease is present, most compatible with  pulmonary edema.  This appears symmetric.  No definite pleural effusions.  Cardiopericardial silhouette is within normal limits for projection.  The cephalization of pulmonary blood flow is noted.  IMPRESSION: Findings most compatible with moderate CHF.  Original Report Authenticated By: Andreas Newport, M.D.   ASSESSMENT and PLAN: 78 year old white male with past medical history significant for cardiomyopathy (unclear ischemic or non-ischemic) with left ventricular dysfunction and ejection fraction of 35%, as well as a documented 3 cm left lower lobe lung mass, who presents to the emergency department with acute onset of shortness of breath this afternoon.  #1. Shortness of breath. I feel his symptoms are multifactorial in etiology. Certainly, he does have a component of mild acute on chronic systolic heart failure. He could have gotten hypertensive and had flash pulmonary edema, which could account for his symptoms. However, he still more hypoxic than I would expect given his minimal clinical exam findings for heart failure. In light of this fact, and the fact that he has an incompletely worked-up lung mass, I am concerned that he may have progressive lung cancer and may have a pulmonary embolism. I think he would benefit from further evaluation with a CT angiogram and CT scan to look at his lung parenchyma. In light of these confounding issues, I have asked the hospitalist to admit the patient and they graciously have agreed to do so. With regard to his heart failure, I would recommend he continue his home dose of Cozaar 50 mg daily. Hold his home Coreg dose while he is in mild respiratory distress.  He would benefit from some diuresis, and I think he should be on Lasix 40 mg IV twice a day.  I would recommend checking an echocardiogram and monitoring his kidney function.  Cycle cardiac enzymes.  [place] [facility] will follow along consultation.   Gwendalyn Ege, MD Level 5 Consult

## 2012-05-03 NOTE — ED Provider Notes (Cosign Needed)
History     CSN: [id number]  Arrival date & time 05/03/12  6213   First MD Initiated Contact with Patient 05/03/12 1900      Chief Complaint  Patient presents with  . Shortness of Breath    (Consider location/radiation/quality/duration/timing/severity/associated sxs/prior treatment) Patient is a 78 y.o. male presenting with shortness of breath. The history is provided by the patient (pt complains of sob.  pt given nitro and lasix by paramedics). No language interpreter was used.  Shortness of Breath  The current episode started today. The problem occurs rarely. The problem has been gradually improving. The problem is severe. Relieved by: lasix. The symptoms are aggravated by nothing. Associated symptoms include shortness of breath. Pertinent negatives include no chest pain and no cough. There was no intake of a foreign body. The Heimlich maneuver was not attempted.    Past Medical History  Diagnosis Date  . Diabetes mellitus   . Hyperlipidemia   . 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 COPD (chronic obstructive pulmonary disease)     PFT's 04/15/10  FEV1 2.50 (92%) ratio 68 DLC0 78%  . Shortness of breath   . Blood transfusion   . GERD (gastroesophageal reflux disease)   . Cancer     skin  . Coronary artery disease   . CHF (congestive heart failure)   . Dysrhythmia   . Peripheral vascular disease   . Anemia   . Depression     Past Surgical History  Procedure Date  . Cataract extraction   . Colonoscopy 2012  . Enteroscopy 12/16/2011    Procedure: ENTEROSCOPY;  Surgeon: Theda Belfast, MD;  Location: [facility];  Service: Endoscopy;  Laterality: N/A;  . Coronary angioplasty with stent placement     Family History  Problem Relation Age of Onset  . Colon cancer Brother     lung cancer - NOT a smoker    History  Substance Use Topics  . Smoking status: Former Smoker -- 1.0 packs/day for 25 years    Types: Cigarettes   . Smokeless tobacco: Not on file  . Alcohol Use: No      Review of Systems  Constitutional: Negative for fatigue.  HENT: Negative for congestion, sinus pressure and ear discharge.   Eyes: Negative for discharge.  Respiratory: Positive for shortness of breath. Negative for cough.   Cardiovascular: Negative for chest pain.  Gastrointestinal: Negative for abdominal pain and diarrhea.  Genitourinary: Negative for frequency and hematuria.  Musculoskeletal: Negative for back pain.  Skin: Negative for rash.  Neurological: Negative for seizures and headaches.  Hematological: Negative.   Psychiatric/Behavioral: Negative for hallucinations.    Allergies  Review of patient's allergies indicates no known allergies.  Home Medications   Current Outpatient Rx  Name Route Sig Dispense Refill  . ASPIRIN 325 MG PO TABS Oral Take 325 mg by mouth daily.    Marland Kitchen CARVEDILOL 3.125 MG PO TABS Oral Take 3.125 mg by mouth 2 (two) times daily with a meal.    . CETIRIZINE HCL 10 MG PO TABS Oral Take 10 mg by mouth daily.      Marland Kitchen CITALOPRAM HYDROBROMIDE 40 MG PO TABS Oral Take 40 mg by mouth daily.    Marland Kitchen FERROUS SULFATE 325 (65 FE) MG PO TABS Oral Take 325 mg by mouth daily with breakfast.    . FUROSEMIDE 20 MG PO TABS Oral Take 20 mg by mouth daily.    Marland Kitchen  INSULIN GLARGINE 100 UNIT/ML [place] SOLN Subcutaneous Inject 40 Units into the skin every morning.     . INSULIN REGULAR HUMAN 100 UNIT/ML IJ SOLN Subcutaneous Inject 10 Units into the skin 4 (four) times daily as needed.     Marland Kitchen LOSARTAN POTASSIUM 50 MG PO TABS Oral Take 50 mg by mouth daily.    Marland Kitchen MECLIZINE HCL 25 MG PO TABS Oral Take 25 mg by mouth 3 (three) times daily as needed. For dizziness.    . METFORMIN HCL 850 MG PO TABS Oral Take 850 mg by mouth 3 (three) times daily.      Marland Kitchen NITROGLYCERIN 0.4 MG SL SUBL Sublingual Place 0.4 mg under the tongue every 5 (five) minutes as needed. For chest pain.    Marland Kitchen OMEPRAZOLE 40 MG PO CPDR Oral Take 40 mg by mouth  daily.      Marland Kitchen POTASSIUM CHLORIDE ER 10 MEQ PO TBCR Oral Take 10 mEq by mouth daily.    Marland Kitchen ROSUVASTATIN CALCIUM 10 MG PO TABS Oral Take 5 mg by mouth daily.      BP 104/62  Pulse 94  Temp(Src) 97.5 F (36.4 C) (Oral)  Resp 23  Wt 185 lb (83.915 kg)  SpO2 98%  Physical Exam  Constitutional: He is oriented to person, place, and time. He appears well-developed.  HENT:  Head: Normocephalic and atraumatic.  Eyes: Conjunctivae and EOM are normal. No scleral icterus.  Neck: Neck supple. No thyromegaly present.  Cardiovascular: Normal rate and regular rhythm.  Exam reveals no gallop and no friction rub.   No murmur heard. Pulmonary/Chest: No stridor. He has no wheezes. He has rales. He exhibits no tenderness.  Abdominal: He exhibits no distension. There is no tenderness. There is no rebound.  Musculoskeletal: Normal range of motion. He exhibits no edema.  Lymphadenopathy:    He has no cervical adenopathy.  Neurological: He is oriented to person, place, and time. Coordination normal.  Skin: No rash noted. No erythema.  Psychiatric: He has a normal mood and affect. His behavior is normal.    ED Course  Procedures (including critical care time)  Labs Reviewed  CBC - Abnormal; Notable for the following:    WBC 11.1 (*)    RDW 18.7 (*)    All other components within normal limits  COMPREHENSIVE METABOLIC PANEL - Abnormal; Notable for the following:    CO2 18 (*)    Glucose, Bld 281 (*)    GFR calc non Af Amer 75 (*)    GFR calc Af Amer 87 (*)    All other components within normal limits  PRO B NATRIURETIC PEPTIDE - Abnormal; Notable for the following:    Pro B Natriuretic peptide (BNP) 1691.0 (*)    All other components within normal limits  POCT I-STAT 3, BLOOD GAS (G3+) - Abnormal; Notable for the following:    pCO2 arterial 34.4 (*)    Bicarbonate 19.8 (*)    Acid-base deficit 5.0 (*)    All other components within normal limits  DIFFERENTIAL  TROPONIN I   Dg Chest Port 1  View  05/03/2012  *RADIOLOGY REPORT*  Clinical Data: Short of breath.  Respiratory failure.  PORTABLE CHEST - 1 VIEW  Comparison: 03/13/2012.  Findings: Monitoring leads are projected over the chest.  Perihilar and basilar predominant airspace disease is present, most compatible with pulmonary edema.  This appears symmetric.  No definite pleural effusions.  Cardiopericardial silhouette is within normal limits for projection.  The cephalization of  pulmonary blood flow is noted.  IMPRESSION: Findings most compatible with moderate CHF.  Original Report Authenticated By: Andreas Newport, M.D.     1. CHF (congestive heart failure)    Pt impoved with tx. bipap stopped.  CRITICAL CARE Performed by: Nedim Oki L   Total critical care time: 40  Critical care time was exclusive of separately billable procedures and treating other patients.  Critical care was necessary to treat or prevent imminent or life-threatening deterioration.  Critical care was time spent personally by me on the following activities: development of treatment plan with patient and/or surrogate as well as nursing, discussions with consultants, evaluation of patient's response to treatment, examination of patient, obtaining history from patient or surrogate, ordering and performing treatments and interventions, ordering and review of laboratory studies, ordering and review of radiographic studies, pulse oximetry and re-evaluation of patient's condition.   Date: 05/03/2012  Rate:94  Rhythm: normal sinus rhythm  QRS Axis: left  Intervals: normal  ST/T Wave abnormalities: nonspecific ST changes  Conduction Disutrbances:left bundle branch block  Narrative Interpretation:   Old EKG Reviewed: unchanged    MDM          Benny Lennert, MD 05/03/12 2156

## 2012-05-04 ENCOUNTER — Encounter (HOSPITAL_COMMUNITY): Payer: Self-pay | Admitting: *Deleted

## 2012-05-04 ENCOUNTER — Inpatient Hospital Stay (HOSPITAL_COMMUNITY): Payer: Medicare PPO

## 2012-05-04 DIAGNOSIS — E1165 Type 2 diabetes mellitus with hyperglycemia: Secondary | ICD-10-CM

## 2012-05-04 DIAGNOSIS — C3430 Malignant neoplasm of lower lobe, unspecified bronchus or lung: Secondary | ICD-10-CM

## 2012-05-04 DIAGNOSIS — J810 Acute pulmonary edema: Secondary | ICD-10-CM

## 2012-05-04 DIAGNOSIS — I517 Cardiomegaly: Secondary | ICD-10-CM

## 2012-05-04 DIAGNOSIS — R222 Localized swelling, mass and lump, trunk: Secondary | ICD-10-CM

## 2012-05-04 DIAGNOSIS — I509 Heart failure, unspecified: Secondary | ICD-10-CM

## 2012-05-04 DIAGNOSIS — E119 Type 2 diabetes mellitus without complications: Secondary | ICD-10-CM | POA: Diagnosis present

## 2012-05-04 DIAGNOSIS — J9601 Acute respiratory failure with hypoxia: Secondary | ICD-10-CM | POA: Diagnosis present

## 2012-05-04 DIAGNOSIS — I10 Essential (primary) hypertension: Secondary | ICD-10-CM

## 2012-05-04 DIAGNOSIS — K219 Gastro-esophageal reflux disease without esophagitis: Secondary | ICD-10-CM | POA: Diagnosis present

## 2012-05-04 HISTORY — DX: Malignant neoplasm of lower lobe, unspecified bronchus or lung: C34.30

## 2012-05-04 LAB — CARDIAC PANEL(CRET KIN+CKTOT+MB+TROPI)
CK, MB: 7.2 ng/mL (ref 0.3–4.0)
Relative Index: 9.2 — ABNORMAL HIGH (ref 0.0–2.5)
Relative Index: INVALID (ref 0.0–2.5)
Relative Index: INVALID (ref 0.0–2.5)
Total CK: 106 U/L (ref 7–232)
Total CK: 90 U/L (ref 7–232)
Total CK: 68 U/L (ref 7–232)

## 2012-05-04 LAB — CBC
HCT: 39.8 % (ref 39.0–52.0)
MCH: 30.1 pg (ref 26.0–34.0)
MCHC: 33.9 g/dL (ref 30.0–36.0)
MCV: 88.8 fL (ref 78.0–100.0)
RDW: 18.7 % — ABNORMAL HIGH (ref 11.5–15.5)

## 2012-05-04 LAB — COMPREHENSIVE METABOLIC PANEL
ALT: 12 U/L (ref 0–53)
AST: 22 U/L (ref 0–37)
Albumin: 3.4 g/dL — ABNORMAL LOW (ref 3.5–5.2)
Alkaline Phosphatase: 65 U/L (ref 39–117)
GFR calc Af Amer: >90 mL/min (ref >90)
Glucose, Bld: 355 mg/dL — ABNORMAL HIGH (ref 70–99)
Potassium: 3.6 mEq/L (ref 3.5–5.1)
Sodium: 137 mEq/L (ref 135–145)
Total Protein: 6.5 g/dL (ref 6.0–8.3)

## 2012-05-04 LAB — GLUCOSE, CAPILLARY
Glucose-Capillary: 205 mg/dL — ABNORMAL HIGH (ref 70–99)
Glucose-Capillary: 350 mg/dL — ABNORMAL HIGH (ref 70–99)
Glucose-Capillary: 206 mg/dL — ABNORMAL HIGH (ref 70–99)

## 2012-05-04 LAB — HEPARIN LEVEL (UNFRACTIONATED): Heparin Unfractionated: 0.27 IU/mL — ABNORMAL LOW (ref 0.30–0.70)

## 2012-05-04 MED ORDER — CITALOPRAM HYDROBROMIDE 40 MG PO TABS
40.0000 mg | ORAL_TABLET | Freq: Every day | ORAL | Status: DC
  Administered 2012-05-04 – 2012-05-08 (×5): 40 mg via ORAL
  Filled 2012-05-04 (×5): qty 1

## 2012-05-04 MED ORDER — ACETAMINOPHEN 650 MG RE SUPP: 650.0000 mg | Freq: Four times a day (QID) | RECTAL | Status: DC | PRN

## 2012-05-04 MED ORDER — HEPARIN (PORCINE) IN NACL 100-0.45 UNIT/ML-% IJ SOLN
1200.0000 [IU]/h | INTRAMUSCULAR | Status: DC
  Administered 2012-05-04: 1200 [IU]/h via INTRAVENOUS
  Filled 2012-05-04: qty 250

## 2012-05-04 MED ORDER — LORATADINE 10 MG PO TABS
10.0000 mg | ORAL_TABLET | Freq: Every day | ORAL | Status: DC
  Administered 2012-05-04 – 2012-05-08 (×5): 10 mg via ORAL
  Filled 2012-05-04 (×5): qty 1

## 2012-05-04 MED ORDER — FUROSEMIDE 10 MG/ML IJ SOLN: 40.0000 mg | Freq: Two times a day (BID) | INTRAMUSCULAR | Status: DC

## 2012-05-04 MED ORDER — INSULIN GLARGINE 100 UNIT/ML ~~LOC~~ SOLN
40.0000 [IU] | Freq: Every morning | SUBCUTANEOUS | Status: DC
  Administered 2012-05-04 – 2012-05-06 (×3): 40 [IU] via SUBCUTANEOUS

## 2012-05-04 MED ORDER — NITROGLYCERIN 0.4 MG SL SUBL: 0.4000 mg | SUBLINGUAL_TABLET | SUBLINGUAL | Status: DC | PRN

## 2012-05-04 MED ORDER — HEPARIN BOLUS VIA INFUSION
4000.0000 [IU] | Freq: Once | INTRAVENOUS | Status: AC
  Administered 2012-05-04: 4000 [IU] via INTRAVENOUS
  Filled 2012-05-04: qty 4000

## 2012-05-04 MED ORDER — DIPHENHYDRAMINE HCL 25 MG PO CAPS
25.0000 mg | ORAL_CAPSULE | Freq: Once | ORAL | Status: AC
  Administered 2012-05-04: 25 mg via ORAL
  Filled 2012-05-04: qty 1

## 2012-05-04 MED ORDER — FUROSEMIDE 10 MG/ML IJ SOLN
40.0000 mg | Freq: Two times a day (BID) | INTRAMUSCULAR | Status: DC
  Administered 2012-05-04 – 2012-05-05 (×3): 40 mg via INTRAVENOUS
  Filled 2012-05-04 (×5): qty 4

## 2012-05-04 MED ORDER — FERROUS SULFATE 325 (65 FE) MG PO TABS
325.0000 mg | ORAL_TABLET | Freq: Every day | ORAL | Status: DC
  Administered 2012-05-04 – 2012-05-08 (×5): 325 mg via ORAL
  Filled 2012-05-04 (×6): qty 1

## 2012-05-04 MED ORDER — INSULIN ASPART 100 UNIT/ML ~~LOC~~ SOLN
0.0000 [IU] | Freq: Three times a day (TID) | SUBCUTANEOUS | Status: DC
  Administered 2012-05-04 (×2): 7 [IU] via SUBCUTANEOUS
  Administered 2012-05-04: 3 [IU] via SUBCUTANEOUS
  Administered 2012-05-05: 2 [IU] via SUBCUTANEOUS
  Administered 2012-05-05: 5 [IU] via SUBCUTANEOUS

## 2012-05-04 MED ORDER — ONDANSETRON HCL 4 MG PO TABS: 4.0000 mg | ORAL_TABLET | Freq: Four times a day (QID) | ORAL | Status: DC | PRN

## 2012-05-04 MED ORDER — LOSARTAN POTASSIUM 50 MG PO TABS: 50.0000 mg | ORAL_TABLET | Freq: Every day | ORAL | Status: DC

## 2012-05-04 MED ORDER — IPRATROPIUM BROMIDE 0.02 % IN SOLN
0.5000 mg | Freq: Four times a day (QID) | RESPIRATORY_TRACT | Status: DC
  Administered 2012-05-04 – 2012-05-05 (×6): 0.5 mg via RESPIRATORY_TRACT
  Filled 2012-05-04 (×6): qty 2.5

## 2012-05-04 MED ORDER — SODIUM CHLORIDE 0.9 % IJ SOLN
3.0000 mL | Freq: Two times a day (BID) | INTRAMUSCULAR | Status: DC
  Administered 2012-05-04: 17:00:00 via INTRAVENOUS

## 2012-05-04 MED ORDER — SODIUM CHLORIDE 0.9 % IJ SOLN
3.0000 mL | Freq: Two times a day (BID) | INTRAMUSCULAR | Status: DC
  Administered 2012-05-04: 3 mL via INTRAVENOUS

## 2012-05-04 MED ORDER — ENOXAPARIN SODIUM 40 MG/0.4ML ~~LOC~~ SOLN
40.0000 mg | SUBCUTANEOUS | Status: DC
  Filled 2012-05-04: qty 0.4

## 2012-05-04 MED ORDER — ASPIRIN 325 MG PO TABS
325.0000 mg | ORAL_TABLET | Freq: Every day | ORAL | Status: DC
  Administered 2012-05-04 – 2012-05-07 (×4): 325 mg via ORAL
  Filled 2012-05-04 (×4): qty 1

## 2012-05-04 MED ORDER — PANTOPRAZOLE SODIUM 40 MG PO TBEC
40.0000 mg | DELAYED_RELEASE_TABLET | Freq: Every day | ORAL | Status: DC
  Administered 2012-05-04 – 2012-05-07 (×4): 40 mg via ORAL
  Filled 2012-05-04 (×4): qty 1

## 2012-05-04 MED ORDER — IOHEXOL 350 MG/ML SOLN: 100.0000 mL | Freq: Once | INTRAVENOUS | Status: AC | PRN

## 2012-05-04 MED ORDER — ONDANSETRON HCL 4 MG/2ML IJ SOLN: 4.0000 mg | Freq: Four times a day (QID) | INTRAMUSCULAR | Status: DC | PRN

## 2012-05-04 MED ORDER — POTASSIUM CHLORIDE ER 10 MEQ PO TBCR
10.0000 meq | EXTENDED_RELEASE_TABLET | Freq: Every day | ORAL | Status: DC
  Administered 2012-05-04 – 2012-05-08 (×5): 10 meq via ORAL
  Filled 2012-05-04 (×5): qty 1

## 2012-05-04 MED ORDER — ALBUTEROL SULFATE (5 MG/ML) 0.5% IN NEBU
2.5000 mg | INHALATION_SOLUTION | Freq: Four times a day (QID) | RESPIRATORY_TRACT | Status: DC
  Administered 2012-05-04 – 2012-05-05 (×7): 2.5 mg via RESPIRATORY_TRACT
  Filled 2012-05-04 (×7): qty 0.5

## 2012-05-04 MED ORDER — LOSARTAN POTASSIUM 50 MG PO TABS
50.0000 mg | ORAL_TABLET | Freq: Every day | ORAL | Status: DC
  Administered 2012-05-04 – 2012-05-08 (×5): 50 mg via ORAL
  Filled 2012-05-04 (×5): qty 1

## 2012-05-04 MED ORDER — ACETAMINOPHEN 325 MG PO TABS
650.0000 mg | ORAL_TABLET | Freq: Four times a day (QID) | ORAL | Status: DC | PRN
  Administered 2012-05-04: 650 mg via ORAL
  Filled 2012-05-04: qty 2

## 2012-05-04 MED ORDER — ATORVASTATIN CALCIUM 10 MG PO TABS
10.0000 mg | ORAL_TABLET | Freq: Every day | ORAL | Status: DC
  Administered 2012-05-04 – 2012-05-06 (×3): 10 mg via ORAL
  Filled 2012-05-04 (×4): qty 1

## 2012-05-04 MED ORDER — HEPARIN (PORCINE) IN NACL 100-0.45 UNIT/ML-% IJ SOLN
1650.0000 [IU]/h | INTRAMUSCULAR | Status: DC
  Administered 2012-05-04: 1400 [IU]/h via INTRAVENOUS
  Administered 2012-05-05: 1500 [IU]/h via INTRAVENOUS
  Administered 2012-05-05 – 2012-05-07 (×3): 1650 [IU]/h via INTRAVENOUS
  Filled 2012-05-04 (×7): qty 250

## 2012-05-04 MED ORDER — ALBUTEROL SULFATE (5 MG/ML) 0.5% IN NEBU
2.5000 mg | INHALATION_SOLUTION | RESPIRATORY_TRACT | Status: DC | PRN
  Administered 2012-05-05: 2.5 mg via RESPIRATORY_TRACT
  Filled 2012-05-04: qty 0.5

## 2012-05-04 MED ORDER — CARVEDILOL 3.125 MG PO TABS
3.1250 mg | ORAL_TABLET | Freq: Two times a day (BID) | ORAL | Status: DC
  Administered 2012-05-04 – 2012-05-08 (×9): 3.125 mg via ORAL
  Filled 2012-05-04 (×11): qty 1

## 2012-05-04 NOTE — Progress Notes (Signed)
CRITICAL VALUE ALERT  Critical value received:  CKMB: 9.7, Troponin: 3.13  Date of notification:  05/04/2012  Time of notification:  0315  Critical value read back:yes  Nurse who received alert:  Fransisco Hertz, RN  MD notified (1st page):  Donnamarie Poag, NP  Time of first page:  0330  MD notified (2nd page):  Time of second page:  Responding MD:  Donnamarie Poag, NP   Time MD responded: [phone]

## 2012-05-04 NOTE — Progress Notes (Signed)
1st Troponin high at 3.13. Called Gwendalyn Ege, MD in cardio, who had already consulted on pt in ED. Dr. Mayford Knife states this is likely elevated from stress of illness, but in case is ACS, he recommended anticoagulation. IV Heparin ordered per pharmacy protocol. Cardio will continue to follow along.  Maren Reamer, NP [facility]

## 2012-05-04 NOTE — ED Notes (Signed)
Called to give report to RN.  Jasmine to call me back shortly.

## 2012-05-04 NOTE — H&P (Addendum)
Hunter Howard is an 78 y.o. male.   PCP - Dr.Warren Tanya Nones. Cardiologist - Dr.Nishan. Chief Complaint: Shortness of breath. HPI: 78 year old male with known history of LV dysfunction last EF measured was 35% was having a discussion with an insurance agent over the phone when he had an argument and he got suddenly short of breath. The shortness of breath became very intense and he had to call the EMS. Patient is given Lasix 80 mg IV and initially was placed on BiPAP which was weaned off and presently is on 100% nonrebreather. Cardiologist on call for [name] was consulted and at this time they have requested hospitalist admission. Patient denies any chest pain, fevers chills, productive cough, diaphoresis, nausea or vomiting. Patient states prior to this incident he was doing perfectly fine and was tending his garden at home.  Past Medical History  Diagnosis Date  . Diabetes mellitus   . Hyperlipidemia   . 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 Shortness of breath   . Blood transfusion   . GERD (gastroesophageal reflux disease)   . Coronary artery disease   . CHF (congestive heart failure)   . Dysrhythmia   . Peripheral vascular disease   . Anemia   . Depression   . COPD (chronic obstructive pulmonary disease)     PFT's 04/15/10  FEV1 2.50 (92%) ratio 68 DLC0 78%    Past Surgical History  Procedure Date  . Cataract extraction   . Colonoscopy 2012  . Enteroscopy 12/16/2011    Procedure: ENTEROSCOPY;  Surgeon: Theda Belfast, MD;  Location: [facility];  Service: Endoscopy;  Laterality: N/A;  . Coronary angioplasty with stent placement   . Mastectomy     Had when 6months old    Family History  Problem Relation Age of Onset  . Colon cancer Brother     lung cancer - NOT a smoker   Social History:  reports that he quit smoking about 30 years ago. His smoking use included Cigarettes. He has a 25 pack-year smoking history. He does not have  any smokeless tobacco history on file. He reports that he drinks alcohol. He reports that he does not use illicit drugs.  Allergies: No Known Allergies  Medications Prior to Admission  Medication Sig Dispense Refill  . aspirin 325 MG tablet Take 325 mg by mouth daily.      . carvedilol (COREG) 3.125 MG tablet Take 3.125 mg by mouth 2 (two) times daily with a meal.      . cetirizine (ZYRTEC) 10 MG tablet Take 10 mg by mouth daily.        . citalopram (CELEXA) 40 MG tablet Take 40 mg by mouth daily.      . ferrous sulfate 325 (65 FE) MG tablet Take 325 mg by mouth daily with breakfast.      . furosemide (LASIX) 20 MG tablet Take 20 mg by mouth daily.      . insulin glargine (LANTUS) 100 UNIT/ML injection Inject 40 Units into the skin every morning.       . insulin regular (HUMULIN R) 100 UNIT/ML injection Inject 10 Units into the skin 4 (four) times daily as needed.       Marland Kitchen losartan (COZAAR) 50 MG tablet Take 50 mg by mouth daily.      . meclizine (MEDI-MECLIZINE) 25 MG tablet Take 25 mg by mouth 3 (three) times daily as needed. For dizziness.      Marland Kitchen  metFORMIN (GLUCOPHAGE) 850 MG tablet Take 850 mg by mouth 3 (three) times daily.        . nitroGLYCERIN (NITROSTAT) 0.4 MG SL tablet Place 0.4 mg under the tongue every 5 (five) minutes as needed. For chest pain.      Marland Kitchen omeprazole (PRILOSEC) 40 MG capsule Take 40 mg by mouth daily.        . potassium chloride (K-DUR) 10 MEQ tablet Take 10 mEq by mouth daily.      . rosuvastatin (CRESTOR) 10 MG tablet Take 5 mg by mouth daily.        Results for orders placed during the hospital encounter of 05/03/12 (from the past 48 hour(s))  CBC     Status: Abnormal   Collection Time   05/03/12  7:10 PM      Component Value Range Comment   WBC 11.1 (*) 4.0 - 10.5 (K/uL)    RBC 4.96  4.22 - 5.81 (MIL/uL)    Hemoglobin 15.7  13.0 - 17.0 (g/dL)    HCT 16.1  09.6 - 04.5 (%)    MCV 90.1  78.0 - 100.0 (fL)    MCH 31.7  26.0 - 34.0 (pg)    MCHC 35.1  30.0 - 36.0  (g/dL)    RDW 40.9 (*) 81.1 - 15.5 (%)    Platelets 200  150 - 400 (K/uL)   DIFFERENTIAL     Status: Normal   Collection Time   05/03/12  7:10 PM      Component Value Range Comment   Neutrophils Relative 64  43 - 77 (%)    Neutro Abs 7.1  1.7 - 7.7 (K/uL)    Lymphocytes Relative 24  12 - 46 (%)    Lymphs Abs 2.7  0.7 - 4.0 (K/uL)    Monocytes Relative 7  3 - 12 (%)    Monocytes Absolute 0.8  0.1 - 1.0 (K/uL)    Eosinophils Relative 5  0 - 5 (%)    Eosinophils Absolute 0.5  0.0 - 0.7 (K/uL)    Basophils Relative 0  0 - 1 (%)    Basophils Absolute 0.0  0.0 - 0.1 (K/uL)   COMPREHENSIVE METABOLIC PANEL     Status: Abnormal   Collection Time   05/03/12  7:10 PM      Component Value Range Comment   Sodium 136  135 - 145 (mEq/L)    Potassium 4.4  3.5 - 5.1 (mEq/L)    Chloride 102  96 - 112 (mEq/L)    CO2 18 (*) 19 - 32 (mEq/L)    Glucose, Bld 281 (*) 70 - 99 (mg/dL)    BUN 20  6 - 23 (mg/dL)    Creatinine, Ser 9.14  0.50 - 1.35 (mg/dL)    Calcium 9.3  8.4 - 10.5 (mg/dL)    Total Protein 7.3  6.0 - 8.3 (g/dL)    Albumin 3.8  3.5 - 5.2 (g/dL)    AST 26  0 - 37 (U/L) HEMOLYSIS AT THIS LEVEL MAY AFFECT RESULT   ALT 17  0 - 53 (U/L)    Alkaline Phosphatase 82  39 - 117 (U/L)    Total Bilirubin 0.4  0.3 - 1.2 (mg/dL)    GFR calc non Af Amer 75 (*) >90 (mL/min)    GFR calc Af Amer 87 (*) >90 (mL/min)   TROPONIN I     Status: Normal   Collection Time   05/03/12  7:11 PM  Component Value Range Comment   Troponin I <0.30  <0.30 (ng/mL)   PRO B NATRIURETIC PEPTIDE     Status: Abnormal   Collection Time   05/03/12  7:11 PM      Component Value Range Comment   Pro B Natriuretic peptide (BNP) 1691.0 (*) 0 - 450 (pg/mL)   POCT I-STAT 3, BLOOD GAS (G3+)     Status: Abnormal   Collection Time   05/03/12  9:38 PM      Component Value Range Comment   pH, Arterial 7.367  7.350 - 7.450     pCO2 arterial 34.4 (*) 35.0 - 45.0 (mmHg)    pO2, Arterial 93.0  80.0 - 100.0 (mmHg)    Bicarbonate 19.8 (*)  20.0 - 24.0 (mEq/L)    TCO2 21  0 - 100 (mmol/L)    O2 Saturation 97.0      Acid-base deficit 5.0 (*) 0.0 - 2.0 (mmol/L)    Patient temperature 98.6 F      Collection site RADIAL, ALLEN'S TEST ACCEPTABLE      Drawn by RT      Sample type ARTERIAL      Dg Chest Port 1 View  05/03/2012  *RADIOLOGY REPORT*  Clinical Data: Short of breath.  Respiratory failure.  PORTABLE CHEST - 1 VIEW  Comparison: 03/13/2012.  Findings: Monitoring leads are projected over the chest.  Perihilar and basilar predominant airspace disease is present, most compatible with pulmonary edema.  This appears symmetric.  No definite pleural effusions.  Cardiopericardial silhouette is within normal limits for projection.  The cephalization of pulmonary blood flow is noted.  IMPRESSION: Findings most compatible with moderate CHF.  Original Report Authenticated By: Andreas Newport, M.D.    Review of Systems  Constitutional: Negative.   HENT: Negative.   Eyes: Negative.   Respiratory: Positive for cough and shortness of breath.   Cardiovascular: Negative.   Gastrointestinal: Negative.   Genitourinary: Negative.   Musculoskeletal: Negative.   Skin: Negative.   Neurological: Negative.   Endo/Heme/Allergies: Negative.   Psychiatric/Behavioral: Negative.     Blood pressure 135/77, pulse 93, temperature 97.4 F (36.3 C), temperature source Oral, resp. rate 23, height 6' 1.5" (1.867 m), weight 82.6 kg (182 lb 1.6 oz), SpO2 96.00%. Physical Exam  Constitutional: He is oriented to person, place, and time. He appears well-developed and well-nourished. No distress.  HENT:  Head: Normocephalic and atraumatic.  Right Ear: External ear normal.  Left Ear: External ear normal.  Mouth/Throat: No oropharyngeal exudate.  Eyes: Conjunctivae are normal. Pupils are equal, round, and reactive to light. Right eye exhibits no discharge. Left eye exhibits no discharge. No scleral icterus.  Neck: Normal range of motion. Neck supple.    Cardiovascular: Normal rate and regular rhythm.   Respiratory: Effort normal. No respiratory distress. He has no wheezes. He has rales.  GI: Soft. Bowel sounds are normal. He exhibits no distension. There is no tenderness. There is no rebound.  Musculoskeletal: Normal range of motion. He exhibits no edema and no tenderness.  Neurological: He is alert and oriented to person, place, and time.       Moves all extremities.  Skin: Skin is warm and dry. He is not diaphoretic.  Psychiatric: His behavior is normal.     Assessment/Plan #1. Acute pulmonary edema secondary to decompensated CHF - appreciate cardiology consult and their recommendations. We'll continue Lasix 40 mg IV every 12 with close followup of metabolic panel intake output. They have also recommended CT angiogram  of the chest to rule out PE and also further assess patient's known lung mass. #2. Left lung mass - patient states he had told his primary care physician that he did not want any further workup on the lung mass even if it is malignant. #3. Diabetes mellitus2 - continue home medications with sliding scale coverage. Hold metformin for now. #4. History of COPD - presently not wheezing. Will place patient on nebulizer. #5. Hypertension - continue present medications. #6. History of GI bleed and had required blood transfusions last year and patient states he has had followup EGD and colonoscopy 3-4 months ago. #7. Mild metabolic acidosis metabolic panel - recheck metabolic panel.  CODE STATUS - DO NOT RESUSCITATE.  Ishani Goldwasser N. 05/04/2012, 2:16 AM

## 2012-05-04 NOTE — Progress Notes (Signed)
ANTICOAGULATION CONSULT NOTE - Initial Consult  Pharmacy Consult for heparin  Indication: chest pain/ACS r/o   No Known Allergies  Patient Measurements: Height: 6' 1.5" (186.7 cm) Weight: 182 lb 1.6 oz (82.6 kg) IBW/kg (Calculated) : 81.05  Heparin Dosing Weight:   Vital Signs: Temp: 97.4 F (36.3 C) (05/10 0125) Temp src: Oral (05/10 0125) BP: 94/57 mmHg (05/10 0300) Pulse Rate: 94  (05/10 0300)  Labs:  Basename 05/04/12 0217 05/03/12 1911 05/03/12 1910  HGB -- -- 15.7  HCT -- -- 44.7  PLT -- -- 200  APTT -- -- --  LABPROT -- -- --  INR -- -- --  HEPARINUNFRC -- -- --  CREATININE -- -- 0.95  CKTOTAL 106 -- --  CKMB 9.7* -- --  TROPONINI 3.13* <0.30 --    Estimated Creatinine Clearance: 68.8 ml/min (by C-G formula based on Cr of 0.95).   Medical History: Past Medical History  Diagnosis Date  . Diabetes mellitus   . Hyperlipidemia   . 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 Shortness of breath   . Blood transfusion   . GERD (gastroesophageal reflux disease)   . Coronary artery disease   . CHF (congestive heart failure)   . Dysrhythmia   . Peripheral vascular disease   . Anemia   . Depression   . COPD (chronic obstructive pulmonary disease)     PFT's 04/15/10  FEV1 2.50 (92%) ratio 68 DLC0 78%    Medications:  Prescriptions prior to admission  Medication Sig Dispense Refill  . aspirin 325 MG tablet Take 325 mg by mouth daily.      . carvedilol (COREG) 3.125 MG tablet Take 3.125 mg by mouth 2 (two) times daily with a meal.      . cetirizine (ZYRTEC) 10 MG tablet Take 10 mg by mouth daily.        . citalopram (CELEXA) 40 MG tablet Take 40 mg by mouth daily.      . ferrous sulfate 325 (65 FE) MG tablet Take 325 mg by mouth daily with breakfast.      . furosemide (LASIX) 20 MG tablet Take 20 mg by mouth daily.      . insulin glargine (LANTUS) 100 UNIT/ML injection Inject 40 Units into the skin every morning.        . insulin regular (HUMULIN R) 100 UNIT/ML injection Inject 10 Units into the skin 4 (four) times daily as needed.       Marland Kitchen losartan (COZAAR) 50 MG tablet Take 50 mg by mouth daily.      . meclizine (MEDI-MECLIZINE) 25 MG tablet Take 25 mg by mouth 3 (three) times daily as needed. For dizziness.      . metFORMIN (GLUCOPHAGE) 850 MG tablet Take 850 mg by mouth 3 (three) times daily.        . nitroGLYCERIN (NITROSTAT) 0.4 MG SL tablet Place 0.4 mg under the tongue every 5 (five) minutes as needed. For chest pain.      Marland Kitchen omeprazole (PRILOSEC) 40 MG capsule Take 40 mg by mouth daily.        . potassium chloride (K-DUR) 10 MEQ tablet Take 10 mEq by mouth daily.      . rosuvastatin (CRESTOR) 10 MG tablet Take 5 mg by mouth daily.        Assessment: 78 yo male with adx for sob due to chf with acute decompensation. Now with elevated  troponin thought to be due to acute process. Heparin until r/o of acs Goal of Therapy:  Heparin level 0.3-0.7 units/ml Monitor platelets by anticoagulation protocol: Yes   Plan:  Heparin 4000 unitsx1 then 1200 units/hr 8 hour HL and daily HL/cbc starting 5/11 with am labs if continued  Janice Coffin 05/04/2012,4:15 AM

## 2012-05-04 NOTE — Progress Notes (Signed)
Advised Junious Silk, PA of [facility] on rounds. No orders.
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
## 2012-05-04 NOTE — Progress Notes (Signed)
Utilization Review Completed.  Anandi Abramo T  05/04/2012.

## 2012-05-04 NOTE — Progress Notes (Signed)
*  PRELIMINARY RESULTS* Echocardiogram 2D Echocardiogram has been performed.  Glean Salen [facility] 05/04/2012, 12:43 PM

## 2012-05-04 NOTE — Progress Notes (Signed)
Inpatient Diabetes Program Recommendations  AACE/ADA: New Consensus Statement on Inpatient Glycemic Control (2009)  Target Ranges:  Prepandial:   less than 140 mg/dL      Peak postprandial:   less than 180 mg/dL (1-2 hours)      Critically ill patients:  140 - 180 mg/dL    Inpatient Diabetes Program Recommendations Correction (SSI): Increase to MODERATE scale TID + HS  Thank you  Piedad Climes [facility] [place] Inpatient Diabetes Coordinator [phone]

## 2012-05-04 NOTE — Progress Notes (Signed)
ANTICOAGULATION CONSULT NOTE - Initial Consult  Pharmacy Consult for heparin  Indication: chest pain/ACS r/o   No Known Allergies  Patient Measurements: Height: 6' 1.5" (186.7 cm) Weight: 182 lb 1.6 oz (82.6 kg) IBW/kg (Calculated) : 81.05  Heparin Dosing Weight:   Vital Signs: Temp: 97.5 F (36.4 C) (05/10 1300) Temp src: Oral (05/10 1300) BP: 109/53 mmHg (05/10 1300) Pulse Rate: 79  (05/10 1300)  Labs:  Basename 05/04/12 1317 05/04/12 0924 05/04/12 0455 05/04/12 0217 05/03/12 1911 05/03/12 1910  HGB -- -- 13.5 -- -- 15.7  HCT -- -- 39.8 -- -- 44.7  PLT -- -- 161 -- -- 200  APTT -- -- -- -- -- --  LABPROT -- -- -- -- -- --  INR -- -- -- -- -- --  HEPARINUNFRC 0.24* -- -- -- -- --  CREATININE -- -- 0.87 -- -- 0.95  CKTOTAL -- 90 -- 106 -- --  CKMB -- 7.2* -- 9.7* -- --  TROPONINI -- 1.13* -- 3.13* <0.30 --    Estimated Creatinine Clearance: 75.1 ml/min (by C-G formula based on Cr of 0.87).   Medical History: Past Medical History  Diagnosis Date  . Diabetes mellitus   . Hyperlipidemia   . 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 Shortness of breath   . Blood transfusion   . GERD (gastroesophageal reflux disease)   . Coronary artery disease   . CHF (congestive heart failure)   . Dysrhythmia   . Peripheral vascular disease   . Anemia   . Depression   . COPD (chronic obstructive pulmonary disease)     PFT's 04/15/10  FEV1 2.50 (92%) ratio 68 DLC0 78%    Medications:  Prescriptions prior to admission  Medication Sig Dispense Refill  . aspirin 325 MG tablet Take 325 mg by mouth daily.      . carvedilol (COREG) 3.125 MG tablet Take 3.125 mg by mouth 2 (two) times daily with a meal.      . cetirizine (ZYRTEC) 10 MG tablet Take 10 mg by mouth daily.        . citalopram (CELEXA) 40 MG tablet Take 40 mg by mouth daily.      . ferrous sulfate 325 (65 FE) MG tablet Take 325 mg by mouth daily with breakfast.      .  furosemide (LASIX) 20 MG tablet Take 20 mg by mouth daily.      . insulin glargine (LANTUS) 100 UNIT/ML injection Inject 40 Units into the skin every morning.       . insulin regular (HUMULIN R) 100 UNIT/ML injection Inject 10 Units into the skin 4 (four) times daily as needed.       Marland Kitchen losartan (COZAAR) 50 MG tablet Take 50 mg by mouth daily.      . meclizine (MEDI-MECLIZINE) 25 MG tablet Take 25 mg by mouth 3 (three) times daily as needed. For dizziness.      . metFORMIN (GLUCOPHAGE) 850 MG tablet Take 850 mg by mouth 3 (three) times daily.        . nitroGLYCERIN (NITROSTAT) 0.4 MG SL tablet Place 0.4 mg under the tongue every 5 (five) minutes as needed. For chest pain.      Marland Kitchen omeprazole (PRILOSEC) 40 MG capsule Take 40 mg by mouth daily.        . potassium chloride (K-DUR) 10 MEQ tablet Take 10 mEq by mouth daily.      Marland Kitchen  rosuvastatin (CRESTOR) 10 MG tablet Take 5 mg by mouth daily.        Assessment: 78 yo male with adx for sob due to chf with acute decompensation. Now with elevated troponin thought to be due to acute process. Heparin level came back at 0.24 so will adjust rate to get to go.  Goal of Therapy:  Heparin level 0.3-0.7 units/ml Monitor platelets by anticoagulation protocol: Yes   Plan:  Increase heparin to 1400 units/hr F/u with 8 hr heparin level  Ulyses Southward Carrington 05/04/2012,2:17 PM

## 2012-05-04 NOTE — Progress Notes (Signed)
[facility]   Subjective: Patient sitting up in chair beside bed. Endorses is hungry and would like to eat now. Denies prior history of heart failure exacerbations. Currently denies shortness of breath or any chest pain.  Objective: Vital signs in last 24 hours: Temp:  [97.4 F (36.3 C)-99.4 F (37.4 C)] 99.4 F (37.4 C) (05/10 0741) Pulse Rate:  [75-100] 84  (05/10 0841) Resp:  [18-36] 23  (05/10 0741) BP: (94-148)/(57-91) 116/62 mmHg (05/10 0841) SpO2:  [64 %-98 %] 93 % (05/10 0741) FiO2 (%):  [100 %] 100 % (05/10 0125) Weight:  [82.6 kg (182 lb 1.6 oz)-83.915 kg (185 lb)] 82.6 kg (182 lb 1.6 oz) (05/10 0125) Weight change:     Intake/Output from previous day: 05/09 0701 - 05/10 0700 In: 480 [P.O.:480] Out: 1100 [Urine:1100] Intake/Output this shift: Total I/O In: 120 [P.O.:120] Out: 600 [Urine:600]  General appearance: alert, cooperative, appears older than stated age and mild distress Resp: Basilar crackles bilaterally but no wheezing, no tachypnea, 4 L nasal cannula oxygen with saturations 87 Cardio: regular rate and sinus rhythm, S1, S2 normal, no murmur, click, rub or gallop GI: soft, non-tender; bowel sounds normal; no masses,  no organomegaly Extremities: extremities normal, atraumatic, no cyanosis or edema Neurologic: Grossly normal  Lab Results:  Basename 05/04/12 0455 05/03/12 1910  WBC 15.3* 11.1*  HGB 13.5 15.7  HCT 39.8 44.7  PLT 161 200   BMET  Basename 05/04/12 0455 05/03/12 1910  NA 137 136  K 3.6 4.4  CL 102 102  CO2 21 18*  GLUCOSE 355* 281*  BUN 21 20  CREATININE 0.87 0.95  CALCIUM 9.3 9.3    Studies/Results: Ct Angio Chest W/cm &/or Wo Cm  05/04/2012  *RADIOLOGY REPORT*  Clinical Data: Low back pain.  Cough.  Shortness breath.  CT ANGIOGRAPHY CHEST  Technique:  Multidetector CT imaging of the chest using the standard protocol during bolus administration of intravenous contrast. Multiplanar reconstructed images including MIPs  were obtained and reviewed to evaluate the vascular anatomy.  Contrast:  100 ml Omnipaque-350.  Comparison: 03/13/2012.  Findings: Left lung base 3.5 x 3.8 cm mass has not cleared and may represent malignancy rather than infection.  Interval development of pulmonary edema and basilar subsegmental atelectasis.  No pulmonary embolus.  Tortuous calcified aorta with ascending thoracic aorta measuring up to 3.8 cm.  Cardiac enlargement most notable left ventricle.  Prominent coronary calcifications.  Enhancing lesion peripheral aspect right lobe liver not as well appreciated on present exam.  Thoracic kyphosis without bony destructive lesion.  IMPRESSION:  Left lung base 3.5 x 3.8 cm mass has not cleared and may represent malignancy rather than infection.  Interval development of pulmonary edema and basilar subsegmental atelectasis.  No pulmonary embolus.  Tortuous calcified aorta with ascending thoracic aorta measuring up to 3.8 cm.  Cardiac enlargement most notable left ventricle.  Prominent coronary calcifications.  Top normal sized mediastinal and hilar lymph nodes.  Enhancing lesion peripheral aspect right lobe liver not as well appreciated on present exam.  Original Report Authenticated By: Fuller Canada, M.D.   Dg Chest Port 1 View  05/03/2012  *RADIOLOGY REPORT*  Clinical Data: Short of breath.  Respiratory failure.  PORTABLE CHEST - 1 VIEW  Comparison: 03/13/2012.  Findings: Monitoring leads are projected over the chest.  Perihilar and basilar predominant airspace disease is present, most compatible with pulmonary edema.  This appears symmetric.  No definite pleural effusions.  Cardiopericardial silhouette is within normal limits for projection.  The cephalization of pulmonary blood flow is noted.  IMPRESSION: Findings most compatible with moderate CHF.  Original Report Authenticated By: Andreas Newport, M.D.    Medications: I have reviewed the patient's current  medications.  Assessment/Plan:  Principal Problem:  *Acute on chronic systolic CHF (congestive heart failure), NYHA class 3 *Appreciate cardiology assistance *Suspect exacerbation of nonischemic dilated cardiomyopathy *Continue diuresis with IV Lasix-since admission is  negative 1100 cc *Continue carvedilol  Active Problems: Acute respiratory failure with hypoxia *CT scan of the chest confirms interval development of pulmonary edema and basilar subsegmental atelectasis since last CT scan 03/13/2012 *No pulmonary embolus was seen CT scan *Continue supportive care with oxygen and pulmonary toileting   Lung mass- left *Basilar left lung mass measures 3.5 x 3.8 cm and has not improved and is concerning for malignancy as opposed to infection *Patient told us today that he did not want to take treatment for cancer but I did discuss with him that if he does have a cancer diagnosis that radiation to shrink the mass and help him with breathing symptoms could be pursued without pursuing surgery and/or chemotherapy if he did not wish. I encouraged him that if a cancer diagnosis were discovered that he should talk with an oncologist before making any decisions about the degree of care he would pursued. *Need to determine if CT-guided needle biopsy we'll obtain adequate specimen versus specimen could be obtained by fiberoptic bronchoscopy   HYPERTENSION *Blood pressure controlled *Continue Cozaar   COPD UNSPECIFIED *Appears to be compensated at this point *Continue albuterol nebs   CAD (coronary artery disease)/ LBBB (left bundle branch block) *Cardiology feels that the patient is not ready for Myoview study due to the severity of his respiratory symptoms *Echo is pending and cardiology would not consider cardiac catheterization unless the ejection fraction is markedly different *Continue beta blocker, statin and aspirin   Diabetes mellitus *Continue Lantus and sliding scale insulin *Was on  Glucophage at home    Microcytic anemia *Continue oral iron replacement   GERD (gastroesophageal reflux disease) *Continue PPI   Disposition *Remain in step down    LOS: 1 day   Junious Silk, ANP pager [phone]  [facility] [email or web address] Password: [email or web address]  05/04/2012, 12:44 PM  I have examined the patient and reviewed the chart. I agree with the above note.   Calvert Cantor, MD [phone]

## 2012-05-05 DIAGNOSIS — R222 Localized swelling, mass and lump, trunk: Secondary | ICD-10-CM

## 2012-05-05 DIAGNOSIS — I10 Essential (primary) hypertension: Secondary | ICD-10-CM

## 2012-05-05 DIAGNOSIS — J810 Acute pulmonary edema: Secondary | ICD-10-CM

## 2012-05-05 DIAGNOSIS — I2510 Atherosclerotic heart disease of native coronary artery without angina pectoris: Secondary | ICD-10-CM

## 2012-05-05 DIAGNOSIS — I5023 Acute on chronic systolic (congestive) heart failure: Principal | ICD-10-CM

## 2012-05-05 DIAGNOSIS — E1165 Type 2 diabetes mellitus with hyperglycemia: Secondary | ICD-10-CM

## 2012-05-05 LAB — CBC
MCHC: 34.7 g/dL (ref 30.0–36.0)
MCV: 88.7 fL (ref 78.0–100.0)
Platelets: 161 10*3/uL (ref 150–400)
RDW: 18.7 % — ABNORMAL HIGH (ref 11.5–15.5)
WBC: 10.1 10*3/uL (ref 4.0–10.5)

## 2012-05-05 LAB — BASIC METABOLIC PANEL
BUN: 21 mg/dL (ref 6–23)
Chloride: 97 mEq/L (ref 96–112)
Creatinine, Ser: 0.86 mg/dL (ref 0.50–1.35)
GFR calc Af Amer: >90 mL/min (ref >90)
GFR calc non Af Amer: 79 mL/min — ABNORMAL LOW (ref >90)

## 2012-05-05 LAB — GLUCOSE, CAPILLARY
Glucose-Capillary: 194 mg/dL — ABNORMAL HIGH (ref 70–99)
Glucose-Capillary: 285 mg/dL — ABNORMAL HIGH (ref 70–99)

## 2012-05-05 MED ORDER — SODIUM CHLORIDE 0.9 % IV SOLN
INTRAVENOUS | Status: DC
  Administered 2012-05-05: 10 mL/h via INTRAVENOUS

## 2012-05-05 MED ORDER — INSULIN ASPART 100 UNIT/ML ~~LOC~~ SOLN
0.0000 [IU] | Freq: Every day | SUBCUTANEOUS | Status: DC
  Administered 2012-05-06: 3 [IU] via SUBCUTANEOUS

## 2012-05-05 MED ORDER — DIPHENHYDRAMINE HCL 25 MG PO CAPS
25.0000 mg | ORAL_CAPSULE | Freq: Every evening | ORAL | Status: DC | PRN
  Administered 2012-05-05 – 2012-05-06 (×2): 25 mg via ORAL
  Filled 2012-05-05 (×2): qty 1

## 2012-05-05 MED ORDER — BIOTENE DRY MOUTH MT LIQD
15.0000 mL | Freq: Two times a day (BID) | OROMUCOSAL | Status: DC
  Administered 2012-05-05 – 2012-05-08 (×7): 15 mL via OROMUCOSAL

## 2012-05-05 MED ORDER — INSULIN ASPART 100 UNIT/ML ~~LOC~~ SOLN
0.0000 [IU] | Freq: Three times a day (TID) | SUBCUTANEOUS | Status: DC
  Administered 2012-05-05: 3 [IU] via SUBCUTANEOUS
  Administered 2012-05-06: 8 [IU] via SUBCUTANEOUS
  Administered 2012-05-06 (×2): 5 [IU] via SUBCUTANEOUS
  Administered 2012-05-07 (×2): 2 [IU] via SUBCUTANEOUS
  Administered 2012-05-07: 3 [IU] via SUBCUTANEOUS
  Administered 2012-05-08: 5 [IU] via SUBCUTANEOUS

## 2012-05-05 MED ORDER — FUROSEMIDE 40 MG PO TABS
40.0000 mg | ORAL_TABLET | Freq: Two times a day (BID) | ORAL | Status: DC
  Administered 2012-05-05 – 2012-05-08 (×6): 40 mg via ORAL
  Filled 2012-05-05 (×9): qty 1

## 2012-05-05 NOTE — Progress Notes (Signed)
[facility]   Subjective: Mr Rehm sitting up in bed with out O2 in no distress. No compliants. Discussed his lung mass again- he has spoken with his family about it and does not want any work up or treatment.   Objective: Vital signs in last 24 hours: Temp:  [97.6 F (36.4 C)-99 F (37.2 C)] 97.8 F (36.6 C) (05/11 1200) Pulse Rate:  [66-90] 66  (05/11 1200) Resp:  [13-24] 16  (05/11 1200) BP: (101-125)/(47-88) 101/88 mmHg (05/11 1200) SpO2:  [90 %-100 %] 94 % (05/11 1200) Weight:  [82 kg (180 lb 12.4 oz)] 82 kg (180 lb 12.4 oz) (05/11 0458) Weight change: -1.915 kg (-4 lb 3.6 oz) Last BM Date: 05/03/12  Intake/Output from previous day: 05/10 0701 - 05/11 0700 In: 420 [P.O.:420] Out: 2600 [Urine:2600] Intake/Output this shift: Total I/O In: 492 [P.O.:360; I.V.:128; IV Piggyback:4] Out: 550 [Urine:550]  General appearance: alert, cooperative Resp: Basilar crackles bilaterally but no wheezing, no tachypnea- 96%on  Room air  Cardio: regular rate and sinus rhythm, S1, S2 normal, no murmur, click, rub or gallop GI: soft, non-tender; bowel sounds normal; no masses,  no organomegaly Extremities: extremities normal, atraumatic, no cyanosis or edema Neurologic: Grossly normal  Lab Results:  Basename 05/05/12 1055 05/04/12 0455  WBC 10.1 15.3*  HGB 13.6 13.5  HCT 39.2 39.8  PLT 161 161   BMET  Basename 05/05/12 1055 05/04/12 0455  NA 135 137  K 4.0 3.6  CL 97 102  CO2 23 21  GLUCOSE 297* 355*  BUN 21 21  CREATININE 0.86 0.87  CALCIUM 9.2 9.3    Studies/Results: Ct Angio Chest W/cm &/or Wo Cm  05/04/2012  *RADIOLOGY REPORT*  Clinical Data: Low back pain.  Cough.  Shortness breath.  CT ANGIOGRAPHY CHEST  Technique:  Multidetector CT imaging of the chest using the standard protocol during bolus administration of intravenous contrast. Multiplanar reconstructed images including MIPs were obtained and reviewed to evaluate the vascular anatomy.  Contrast:  100 ml  Omnipaque-350.  Comparison: 03/13/2012.  Findings: Left lung base 3.5 x 3.8 cm mass has not cleared and may represent malignancy rather than infection.  Interval development of pulmonary edema and basilar subsegmental atelectasis.  No pulmonary embolus.  Tortuous calcified aorta with ascending thoracic aorta measuring up to 3.8 cm.  Cardiac enlargement most notable left ventricle.  Prominent coronary calcifications.  Enhancing lesion peripheral aspect right lobe liver not as well appreciated on present exam.  Thoracic kyphosis without bony destructive lesion.  IMPRESSION:  Left lung base 3.5 x 3.8 cm mass has not cleared and may represent malignancy rather than infection.  Interval development of pulmonary edema and basilar subsegmental atelectasis.  No pulmonary embolus.  Tortuous calcified aorta with ascending thoracic aorta measuring up to 3.8 cm.  Cardiac enlargement most notable left ventricle.  Prominent coronary calcifications.  Top normal sized mediastinal and hilar lymph nodes.  Enhancing lesion peripheral aspect right lobe liver not as well appreciated on present exam.  Original Report Authenticated By: Fuller Canada, M.D.   Dg Chest Port 1 View  05/03/2012  *RADIOLOGY REPORT*  Clinical Data: Short of breath.  Respiratory failure.  PORTABLE CHEST - 1 VIEW  Comparison: 03/13/2012.  Findings: Monitoring leads are projected over the chest.  Perihilar and basilar predominant airspace disease is present, most compatible with pulmonary edema.  This appears symmetric.  No definite pleural effusions.  Cardiopericardial silhouette is within normal limits for projection.  The cephalization of pulmonary blood flow is  noted.  IMPRESSION: Findings most compatible with moderate CHF.  Original Report Authenticated By: Andreas Newport, M.D.    Medications: I have reviewed the patient's current medications.  Assessment/Plan:  Principal Problem:  *Acute on chronic systolic CHF with acute respiratory failure  needing Bipap ejection fraction was in the range of 25% to 30% on ECHO this admission *Appreciate cardiology assistance *cont lasix per cards  *Continue carvedilol No PE per CT   Lung mass- left *Basilar left lung mass measures 3.5 x 3.8 cm and has not improved and is concerning for malignancy as opposed to infection *no further work up per pt request   HYPERTENSION *Blood pressure controlled *Continue Cozaar   COPD UNSPECIFIED *Appears to be compensated at this point *Continue albuterol nebs   CAD (coronary artery disease) with elevated troponins  *Myoview tomorrow On Heparin per *Continue beta blocker, statin and aspirin   Diabetes mellitus *Continue Lantus - increase to moderate dose sliding scale insulin *Was on Glucophage at home    Microcytic anemia *Continue oral iron replacement   GERD (gastroesophageal reflux disease) *Continue PPI   Disposition Transfer to telemetry.     LOS: 2 days   05/05/2012, 1:00 PM  Calvert Cantor, MD [phone]

## 2012-05-05 NOTE — Progress Notes (Signed)
ANTICOAGULATION CONSULT NOTE - Follow Up Consult  Pharmacy Consult for heparin Indication: chest pain/ACS  Labs:  Basename 05/05/12 0832 05/04/12 2226 05/04/12 1753 05/04/12 1317 05/04/12 0924 05/04/12 0455 05/04/12 0217 05/03/12 1910  HGB -- -- -- -- -- 13.5 -- 15.7  HCT -- -- -- -- -- 39.8 -- 44.7  PLT -- -- -- -- -- 161 -- 200  APTT -- -- -- -- -- -- -- --  LABPROT -- -- -- -- -- -- -- --  INR -- -- -- -- -- -- -- --  HEPARINUNFRC 0.29* 0.27* -- 0.24* -- -- -- --  CREATININE -- -- -- -- -- 0.87 -- 0.95  CKTOTAL -- -- 68 -- 90 -- 106 --  CKMB -- -- 4.5* -- 7.2* -- 9.7* --  TROPONINI -- -- 1.02* -- 1.13* -- 3.13* --    Assessment: 78 yo male with adx for sob due to chf with acute decompensation. Now with elevated troponin thought to be due to acute process. Heparin level came back slightly subtherapeutic after rate increase at 0.29. No issues per RN. CBC stable.   Goal of Therapy:  Heparin level 0.3-0.7 units/ml   Plan:  Will increase heparin gtt to 1650 units/hr and check level in am  Janace Litten PharmD BCPS 05/05/2012,11:00 AM

## 2012-05-05 NOTE — Progress Notes (Signed)
SUBJECTIVE:  He says that he is breathing great.  No chest pain   PHYSICAL EXAM Filed Vitals:   05/05/12 0753 05/05/12 0800 05/05/12 0811 05/05/12 0906  BP: 125/59 125/59 125/59   Pulse: 68 70 70   Temp:  97.8 F (36.6 C) 97.8 F (36.6 C)   TempSrc:  Oral Oral   Resp:  16 13   Height:      Weight:      SpO2:  93% 93% 95%   General:  No distress HEENT:  PERRL Lungs:  Few basilar crackles Heart:  RRR Abdomen:  Positive bowel sounds, no rebound no guarding Extremities:  No edema Neuro: Intact.  LABS: Lab Results  Component Value Date   CKTOTAL 68 05/04/2012   CKMB 4.5* 05/04/2012   TROPONINI 1.02* 05/04/2012   Results for orders placed during the hospital encounter of 05/03/12 (from the past 24 hour(s))  GLUCOSE, CAPILLARY     Status: Abnormal   Collection Time   05/04/12 11:52 AM      Component Value Range   Glucose-Capillary 205 (*) 70 - 99 (mg/dL)   Comment 1 Notify RN    HEPARIN LEVEL (UNFRACTIONATED)     Status: Abnormal   Collection Time   05/04/12  1:17 PM      Component Value Range   Heparin Unfractionated 0.24 (*) 0.30 - 0.70 (IU/mL)  GLUCOSE, CAPILLARY     Status: Abnormal   Collection Time   05/04/12  4:23 PM      Component Value Range   Glucose-Capillary 350 (*) 70 - 99 (mg/dL)  CARDIAC PANEL(CRET KIN+CKTOT+MB+TROPI)     Status: Abnormal   Collection Time   05/04/12  5:53 PM      Component Value Range   Total CK 68  7 - 232 (U/L)   CK, MB 4.5 (*) 0.3 - 4.0 (ng/mL)   Troponin I 1.02 (*) <0.30 (ng/mL)   Relative Index RELATIVE INDEX IS INVALID  0.0 - 2.5   GLUCOSE, CAPILLARY     Status: Abnormal   Collection Time   05/04/12 10:09 PM      Component Value Range   Glucose-Capillary 206 (*) 70 - 99 (mg/dL)   Comment 1 Documented in Chart     Comment 2 Notify RN    HEPARIN LEVEL (UNFRACTIONATED)     Status: Abnormal   Collection Time   05/04/12 10:26 PM      Component Value Range   Heparin Unfractionated 0.27 (*) 0.30 - 0.70 (IU/mL)  GLUCOSE,  CAPILLARY     Status: Abnormal   Collection Time   05/05/12  8:03 AM      Component Value Range   Glucose-Capillary 194 (*) 70 - 99 (mg/dL)    Intake/Output Summary (Last 24 hours) at 05/05/12 1009 Last data filed at 05/05/12 0900  Gross per 24 hour  Intake    324 ml  Output   2150 ml  Net  -1826 ml   Chest CT:  Left lung base 3.5 x 3.8 cm mass has not cleared and may represent  malignancy rather than infection.  Interval development of pulmonary edema and basilar subsegmental  atelectasis.  No pulmonary embolus.  Tortuous calcified aorta with ascending thoracic aorta measuring up  to 3.8 cm.  Cardiac enlargement most notable left ventricle. Prominent  coronary calcifications.  Top normal sized mediastinal and hilar lymph nodes.  Enhancing lesion peripheral aspect right lobe liver not as well  appreciated on present exam.  ECHO:  -  Left ventricle: Wall thickness was increased in a pattern of severe LVH. Systolic function was severely reduced. The estimated ejection fraction was in the range of 25% to 30%. There was fusion of early and atrial contributions to ventricular filling.  ASSESSMENT AND PLAN:   1) Acute on chronic systolic CHF (congestive heart failure), NYHA class 3: I don't think that the EF represents a significant change.  Change to PO Lasix.     2) HYPERTENSION:  Controlled     3) COPD UNSPECIFIED:  Per primary team.   4) CAD (coronary artery disease):  I will plan a Lexiscan Myoview for tomorrow.   5) Lung mass- Question malignancy.  See CT above.  Plan per primary team as above.  The patient says that he does not want a biopsy or treatment.   Fayrene Fearing [facility] 05/05/2012 10:09 AM

## 2012-05-05 NOTE — Progress Notes (Signed)
ANTICOAGULATION CONSULT NOTE - Follow Up Consult  Pharmacy Consult for heparin Indication: chest pain/ACS  Labs:  Basename 05/04/12 2226 05/04/12 1753 05/04/12 1317 05/04/12 0924 05/04/12 0455 05/04/12 0217 05/03/12 1910  HGB -- -- -- -- 13.5 -- 15.7  HCT -- -- -- -- 39.8 -- 44.7  PLT -- -- -- -- 161 -- 200  APTT -- -- -- -- -- -- --  LABPROT -- -- -- -- -- -- --  INR -- -- -- -- -- -- --  HEPARINUNFRC 0.27* -- 0.24* -- -- -- --  CREATININE -- -- -- -- 0.87 -- 0.95  CKTOTAL -- 68 -- 90 -- 106 --  CKMB -- 4.5* -- 7.2* -- 9.7* --  TROPONINI -- 1.02* -- 1.13* -- 3.13* --    Assessment: 78yo male slightly subtherapeutic on heparin with initial dosing for CP.  Goal of Therapy:  Heparin level 0.3-0.7 units/ml   Plan:  Will increase heparin gtt by ~1 unit/kg/hr to 1500 units/hr and check level in 8hr.  Colleen Can PharmD BCPS 05/05/2012,12:01 AM

## 2012-05-06 ENCOUNTER — Inpatient Hospital Stay (HOSPITAL_COMMUNITY): Payer: Medicare PPO

## 2012-05-06 DIAGNOSIS — I10 Essential (primary) hypertension: Secondary | ICD-10-CM

## 2012-05-06 DIAGNOSIS — E1165 Type 2 diabetes mellitus with hyperglycemia: Secondary | ICD-10-CM

## 2012-05-06 DIAGNOSIS — R222 Localized swelling, mass and lump, trunk: Secondary | ICD-10-CM

## 2012-05-06 DIAGNOSIS — J810 Acute pulmonary edema: Secondary | ICD-10-CM

## 2012-05-06 LAB — BASIC METABOLIC PANEL
BUN: 19 mg/dL (ref 6–23)
CO2: 24 mEq/L (ref 19–32)
Calcium: 9.3 mg/dL (ref 8.4–10.5)
Chloride: 99 mEq/L (ref 96–112)
Creatinine, Ser: 0.84 mg/dL (ref 0.50–1.35)
GFR calc Af Amer: >90 mL/min (ref >90)
GFR calc non Af Amer: 79 mL/min — ABNORMAL LOW (ref >90)
Glucose, Bld: 190 mg/dL — ABNORMAL HIGH (ref 70–99)
Glucose, Bld: 275 mg/dL — ABNORMAL HIGH (ref 70–99)
Potassium: 3.5 mEq/L (ref 3.5–5.1)
Potassium: 3.8 mEq/L (ref 3.5–5.1)
Sodium: 136 mEq/L (ref 135–145)

## 2012-05-06 LAB — GLUCOSE, CAPILLARY
Glucose-Capillary: 203 mg/dL — ABNORMAL HIGH (ref 70–99)
Glucose-Capillary: 258 mg/dL — ABNORMAL HIGH (ref 70–99)

## 2012-05-06 LAB — CBC
HCT: 38.9 % — ABNORMAL LOW (ref 39.0–52.0)
Hemoglobin: 13.5 g/dL (ref 13.0–17.0)
RBC: 4.35 MIL/uL (ref 4.22–5.81)
WBC: 8.1 10*3/uL (ref 4.0–10.5)

## 2012-05-06 LAB — PROTIME-INR
INR: 1.02 (ref 0.00–1.49)
Prothrombin Time: 13.6 seconds (ref 11.6–15.2)

## 2012-05-06 MED ORDER — SODIUM CHLORIDE 0.9 % IJ SOLN: 3.0000 mL | INTRAMUSCULAR | Status: DC | PRN

## 2012-05-06 MED ORDER — TECHNETIUM TC 99M TETROFOSMIN IV KIT
10.0000 | PACK | Freq: Once | INTRAVENOUS | Status: AC | PRN
  Administered 2012-05-06: 10 via INTRAVENOUS

## 2012-05-06 MED ORDER — REGADENOSON 0.4 MG/5ML IV SOLN
0.4000 mg | Freq: Once | INTRAVENOUS | Status: AC
  Administered 2012-05-06: 0.4 mg via INTRAVENOUS

## 2012-05-06 MED ORDER — SODIUM CHLORIDE 0.9 % IV SOLN: 250.0000 mL | INTRAVENOUS | Status: DC | PRN

## 2012-05-06 MED ORDER — TECHNETIUM TC 99M TETROFOSMIN IV KIT
30.0000 | PACK | Freq: Once | INTRAVENOUS | Status: AC | PRN
  Administered 2012-05-06: 30 via INTRAVENOUS

## 2012-05-06 MED ORDER — SODIUM CHLORIDE 0.9 % IJ SOLN
3.0000 mL | Freq: Two times a day (BID) | INTRAMUSCULAR | Status: DC
  Administered 2012-05-07: 3 mL via INTRAVENOUS

## 2012-05-06 MED ORDER — IPRATROPIUM BROMIDE 0.02 % IN SOLN: 0.5000 mg | Freq: Four times a day (QID) | RESPIRATORY_TRACT | Status: DC | PRN

## 2012-05-06 MED ORDER — DIAZEPAM 5 MG PO TABS
5.0000 mg | ORAL_TABLET | ORAL | Status: AC
  Administered 2012-05-07: 5 mg via ORAL
  Filled 2012-05-06: qty 1

## 2012-05-06 MED ORDER — INSULIN GLARGINE 100 UNIT/ML ~~LOC~~ SOLN: 45.0000 [IU] | Freq: Every morning | SUBCUTANEOUS | Status: DC

## 2012-05-06 MED ORDER — ASPIRIN 81 MG PO CHEW
324.0000 mg | CHEWABLE_TABLET | ORAL | Status: AC
  Administered 2012-05-07: 324 mg via ORAL
  Filled 2012-05-06: qty 4

## 2012-05-06 MED ORDER — SODIUM CHLORIDE 0.9 % IV SOLN: INTRAVENOUS | Status: DC

## 2012-05-06 MED ORDER — ALBUTEROL SULFATE (5 MG/ML) 0.5% IN NEBU: 2.5000 mg | INHALATION_SOLUTION | Freq: Four times a day (QID) | RESPIRATORY_TRACT | Status: DC | PRN

## 2012-05-06 NOTE — Progress Notes (Signed)
Subjective: Feels much better.   Objective: Vital signs in last 24 hours: Temp:  [97 F (36.1 C)-98.6 F (37 C)] 97 F (36.1 C) (05/12 0557) Pulse Rate:  [63-84] 73  (05/12 0911) Resp:  [16-20] 18  (05/12 0557) BP: (101-136)/(53-88) 131/74 mmHg (05/12 0911) SpO2:  [91 %-95 %] 94 % (05/12 0557) Weight:  [81.647 kg (180 lb)] 81.647 kg (180 lb) (05/12 0557) Weight change: -0.353 kg (-12.4 oz) Last BM Date: 05/04/12  Intake/Output from previous day: 05/11 0701 - 05/12 0700 In: 1699.1 [P.O.:1170; I.V.:525.1; IV Piggyback:4] Out: 2780 [Urine:2780] Total I/O In: 240 [P.O.:240] Out: 150 [Urine:150]   Physical Exam: General: Comfortable, alert, communicative, fully oriented, not short of breath at rest.  HEENT:  No clinical pallor, no jaundice, no conjunctival injection or discharge. Appears euvolemic, clinically. NECK:  Supple, JVP not seen, no carotid bruits, no palpable lymphadenopathy, no palpable goiter. CHEST:  Clinically clear to auscultation, no wheezes, no crackles. HEART:  Sounds 1 and 2 heard, normal, regular, no murmurs. ABDOMEN:  Full, soft, non-tender, no palpable organomegaly, no palpable masses, normal bowel sounds. GENITALIA:  Not examined. LOWER EXTREMITIES:  No pitting edema, palpable peripheral pulses. MUSCULOSKELETAL SYSTEM:  Generalized osteoarthritic changes, otherwise, normal. CENTRAL NERVOUS SYSTEM:  No focal neurologic deficit on gross examination.  Lab Results:  Basename 05/05/12 1055 05/04/12 0455  WBC 10.1 15.3*  HGB 13.6 13.5  HCT 39.2 39.8  PLT 161 161    Basename 05/06/12 0638 05/05/12 1055  NA 139 135  K 3.5 4.0  CL 100 97  CO2 25 23  GLUCOSE 190* 297*  BUN 19 21  CREATININE 0.84 0.86  CALCIUM 9.3 9.2   Recent Results (from the past 240 hour(s))  MRSA PCR SCREENING     Status: Normal   Collection Time   05/04/12  1:30 AM      Component Value Range Status Comment   MRSA by PCR NEGATIVE  NEGATIVE  Final      Studies/Results: Ct  Angio Chest W/cm &/or Wo Cm  05/04/2012  *RADIOLOGY REPORT*  Clinical Data: Low back pain.  Cough.  Shortness breath.  CT ANGIOGRAPHY CHEST  Technique:  Multidetector CT imaging of the chest using the standard protocol during bolus administration of intravenous contrast. Multiplanar reconstructed images including MIPs were obtained and reviewed to evaluate the vascular anatomy.  Contrast:  100 ml Omnipaque-350.  Comparison: 03/13/2012.  Findings: Left lung base 3.5 x 3.8 cm mass has not cleared and may represent malignancy rather than infection.  Interval development of pulmonary edema and basilar subsegmental atelectasis.  No pulmonary embolus.  Tortuous calcified aorta with ascending thoracic aorta measuring up to 3.8 cm.  Cardiac enlargement most notable left ventricle.  Prominent coronary calcifications.  Enhancing lesion peripheral aspect right lobe liver not as well appreciated on present exam.  Thoracic kyphosis without bony destructive lesion.  IMPRESSION:  Left lung base 3.5 x 3.8 cm mass has not cleared and may represent malignancy rather than infection.  Interval development of pulmonary edema and basilar subsegmental atelectasis.  No pulmonary embolus.  Tortuous calcified aorta with ascending thoracic aorta measuring up to 3.8 cm.  Cardiac enlargement most notable left ventricle.  Prominent coronary calcifications.  Top normal sized mediastinal and hilar lymph nodes.  Enhancing lesion peripheral aspect right lobe liver not as well appreciated on present exam.  Original Report Authenticated By: Fuller Canada, M.D.    Medications: Scheduled Meds:   . antiseptic oral rinse  15 mL Mouth Rinse  BID  . aspirin  325 mg Oral Daily  . atorvastatin  10 mg Oral q1800  . carvedilol  3.125 mg Oral BID WC  . citalopram  40 mg Oral Daily  . ferrous sulfate  325 mg Oral Q breakfast  . furosemide  40 mg Oral BID  . insulin aspart  0-15 Units Subcutaneous TID WC  . insulin aspart  0-5 Units Subcutaneous  QHS  . insulin glargine  40 Units Subcutaneous q morning - 10a  . loratadine  10 mg Oral Daily  . losartan  50 mg Oral Daily  . pantoprazole  40 mg Oral Q1200  . potassium chloride  10 mEq Oral Daily  . regadenoson  0.4 mg Intravenous Once  . DISCONTD: albuterol  2.5 mg Nebulization Q6H  . DISCONTD: insulin aspart  0-9 Units Subcutaneous TID WC  . DISCONTD: ipratropium  0.5 mg Nebulization Q6H   Continuous Infusions:   . sodium chloride 10 mL/hr (05/05/12 0810)  . heparin 1,650 Units/hr (05/06/12 1036)   PRN Meds:.acetaminophen, acetaminophen, albuterol, diphenhydrAMINE, ipratropium, nitroGLYCERIN, ondansetron (ZOFRAN) IV, ondansetron, technetium tetrofosmin, technetium tetrofosmin, DISCONTD: albuterol  Assessment/Plan:  1. Acute on chronic systolic CHF/acute respiratory failure needing Bipap: Patient presented with acute dyspnea, and CXR findings consistent with decompensated CHF. 2D echocardiogram showed ejection fraction of 25% to 30%. He was managed with intravenous Lasix, with satisfactory clinical response. Cardiology consultation was provided by Dr Aldona Bar, who was very helpful in optimizing heat failure treatment. Patient has been transitioned to oral lasix, as of 05/06/2012. Patient did have acute hypoxic respiratory failure at presentation, requiring oxygen supplemenation and BIPAP. This was felt to be multifactorial, due to CHF, underlying COPD, and known lung mass. Fortunately, CTA of 05/04/12, showed no evidence of pulmonary embolism. As of 05/06/12, patient is saturation at 91%-95% on room air. 2. Lung mass- left: Patent has a known basilar 3.5 x 3.8 cm left lung mass, concerning for malignancy. Patient has consistently declined any further work up. 3. HYPERTENSION:   Blood pressure is controlled on Cozaar, Coreg and diuretics.   4. COPD UNSPECIFIED: No evidence of exacerbation, and appears to be stable at this time.  Patient is on Albuterol nebs  5. CAD (coronary artery  disease) with elevated troponins  Patient has a cardiomyopathy, and at the present time, this is thought to be possibly ischemic, given his obvious cardiovascular risk factors. He underwent a stress Myoview on 05/06/12. Report is pending at this time. Continue beta blocker, statin and aspirin  6. Diabetes mellitus:  This is sub-optimally controlled at this time. Managing with diet, SSI and Lantus. Titrating as indicted.  Glucophage was discontinued, in view of CHF decompensation.  7. GERD (gastroesophageal reflux disease):  Asymptomatic on PPI.  Comment: Disposition is per cardiology.     LOS: 3 days   Hunter Howard,CHRISTOPHER 05/06/2012, 10:55 AM

## 2012-05-06 NOTE — Progress Notes (Signed)
Received phone call from Radiologist, Dr. Breck Coons, stating that stress test was positive with EF of 18%, global hypokinesis, with antero/apical ischemia. Report to follow.

## 2012-05-06 NOTE — Progress Notes (Signed)
 SUBJECTIVE:  Never any chest pain.  States breathing has been good for past two days. Had Lexiscan Myoview earlier today, results pending.    PHYSICAL EXAM Filed Vitals:   05/06/12 0857 05/06/12 0907 05/06/12 0909 05/06/12 0911  BP: 133/74 125/69 136/76 131/74  Pulse: 63 84 75 73  Temp:      TempSrc:      Resp:      Height:      Weight:      SpO2:       General:  No distress HEENT:  PERRL Lungs:  Clear to auscultation. Heart:  RRR Abdomen:  Positive bowel sounds, no rebound no guarding Extremities:  No edema Neuro: Intact.  LABS: Lab Results  Component Value Date   CKTOTAL 68 05/04/2012   CKMB 4.5* 05/04/2012   TROPONINI 1.02* 05/04/2012   Results for orders placed during the hospital encounter of 05/03/12 (from the past 24 hour(s))  CBC     Status: Abnormal   Collection Time   05/05/12 10:55 AM      Component Value Range   WBC 10.1  4.0 - 10.5 (K/uL)   RBC 4.42  4.22 - 5.81 (MIL/uL)   Hemoglobin 13.6  13.0 - 17.0 (g/dL)   HCT 39.2  39.0 - 52.0 (%)   MCV 88.7  78.0 - 100.0 (fL)   MCH 30.8  26.0 - 34.0 (pg)   MCHC 34.7  30.0 - 36.0 (g/dL)   RDW 18.7 (*) 11.5 - 15.5 (%)   Platelets 161  150 - 400 (K/uL)  BASIC METABOLIC PANEL     Status: Abnormal   Collection Time   05/05/12 10:55 AM      Component Value Range   Sodium 135  135 - 145 (mEq/L)   Potassium 4.0  3.5 - 5.1 (mEq/L)   Chloride 97  96 - 112 (mEq/L)   CO2 23  19 - 32 (mEq/L)   Glucose, Bld 297 (*) 70 - 99 (mg/dL)   BUN 21  6 - 23 (mg/dL)   Creatinine, Ser 0.86  0.50 - 1.35 (mg/dL)   Calcium 9.2  8.4 - 10.5 (mg/dL)   GFR calc non Af Amer 79 (*) >90 (mL/min)   GFR calc Af Amer >90  >90 (mL/min)  GLUCOSE, CAPILLARY     Status: Abnormal   Collection Time   05/05/12 12:04 PM      Component Value Range   Glucose-Capillary 285 (*) 70 - 99 (mg/dL)  GLUCOSE, CAPILLARY     Status: Abnormal   Collection Time   05/05/12  4:57 PM      Component Value Range   Glucose-Capillary 187 (*) 70 - 99 (mg/dL)   Comment 1 Documented in Chart     Comment 2 Notify RN    GLUCOSE, CAPILLARY     Status: Abnormal   Collection Time   05/05/12  9:05 PM      Component Value Range   Glucose-Capillary 171 (*) 70 - 99 (mg/dL)   Comment 1 Notify RN    GLUCOSE, CAPILLARY     Status: Abnormal   Collection Time   05/06/12  6:02 AM      Component Value Range   Glucose-Capillary 203 (*) 70 - 99 (mg/dL)  HEPARIN LEVEL (UNFRACTIONATED)     Status: Normal   Collection Time   05/06/12  6:38 AM      Component Value Range   Heparin Unfractionated 0.39  0.30 - 0.70 (IU/mL)  BASIC METABOLIC PANEL       Status: Abnormal   Collection Time   05/06/12  6:38 AM      Component Value Range   Sodium 139  135 - 145 (mEq/L)   Potassium 3.5  3.5 - 5.1 (mEq/L)   Chloride 100  96 - 112 (mEq/L)   CO2 25  19 - 32 (mEq/L)   Glucose, Bld 190 (*) 70 - 99 (mg/dL)   BUN 19  6 - 23 (mg/dL)   Creatinine, Ser 0.84  0.50 - 1.35 (mg/dL)   Calcium 9.3  8.4 - 10.5 (mg/dL)   GFR calc non Af Amer 79 (*) >90 (mL/min)   GFR calc Af Amer >90  >90 (mL/min)    Intake/Output Summary (Last 24 hours) at 05/06/12 1026 Last data filed at 05/06/12 0832  Gross per 24 hour  Intake 1620.05 ml  Output   2630 ml  Net -1009.95 ml   Chest CT:  Left lung base 3.5 x 3.8 cm mass has not cleared and may represent  malignancy rather than infection.  Interval development of pulmonary edema and basilar subsegmental  atelectasis.  No pulmonary embolus.  Tortuous calcified aorta with ascending thoracic aorta measuring up  to 3.8 cm.  Cardiac enlargement most notable left ventricle. Prominent  coronary calcifications.  Top normal sized mediastinal and hilar lymph nodes.  Enhancing lesion peripheral aspect right lobe liver not as well  appreciated on present exam.  ECHO:  - Left ventricle: Wall thickness was increased in a pattern of severe LVH. Systolic function was severely reduced. The estimated ejection fraction was in the range of 25% to 30%. There  was fusion of early and atrial contributions to ventricular filling.  ASSESSMENT AND PLAN:   1) Acute on chronic systolic CHF (congestive heart failure), NYHA class 3: I don't think that the EF represents a significant change.  Change to PO Lasix.     2) HYPERTENSION:  Controlled     3) COPD UNSPECIFIED:  Per primary team.   4) CAD (coronary artery disease):  Lexiscan results pending. If negative, no further cardiac testing planned this admission.   5) Lung mass- Question malignancy.  See CT above.  Plan per primary team as above.  The patient says that he does not want a biopsy or treatment.   Gwendelyn Lanting 05/06/2012 10:26 AM   

## 2012-05-06 NOTE — Progress Notes (Signed)
ANTICOAGULATION CONSULT NOTE - Follow Up Consult  Pharmacy Consult for Heparin Indication: chest pain/ACS  Assessment: 78 yo male admitted for shortness of breath due to chf with acute decompensation, with subsequent elevated troponin thought to be due to acute process. Heparin level therapeutic this morning at 0.39 at current rate of 1650 units/hr. No issue reported by RN or in chart. CBC and renal function stable.   Goal of Therapy:  Heparin level 0.3-0.7 units/ml  Plan:  Continue IV heparin at current rate of 1650 units/hr Follow-up daily HL and CBC in the morning   Benjaman Pott, PharmD     Pager [phone] 05/06/2012   7:49 AM  ----------------------------------  Patient Measurements: Height: 6\' 1"  (185.4 cm) Weight: 180 lb (81.647 kg) (scale A) IBW/kg (Calculated) : 79.9   Vital Signs: Temp: 97 F (36.1 C) (05/12 0557) Temp src: Oral (05/12 0557) BP: 111/53 mmHg (05/12 0557) Pulse Rate: 63  (05/12 0557)  Labs:  Alvira Philips 05/06/12 2956 05/05/12 1055 05/05/12 0832 05/04/12 2226 05/04/12 1753 05/04/12 0924 05/04/12 0455 05/04/12 0217 05/03/12 1910  HGB -- 13.6 -- -- -- -- 13.5 -- --  HCT -- 39.2 -- -- -- -- 39.8 -- 44.7  PLT -- 161 -- -- -- -- 161 -- 200  APTT -- -- -- -- -- -- -- -- --  LABPROT -- -- -- -- -- -- -- -- --  INR -- -- -- -- -- -- -- -- --  HEPARINUNFRC 0.39 -- 0.29* 0.27* -- -- -- -- --  CREATININE -- 0.86 -- -- -- -- 0.87 -- 0.95  CKTOTAL -- -- -- -- 68 90 -- 106 --  CKMB -- -- -- -- 4.5* 7.2* -- 9.7* --  TROPONINI -- -- -- -- 1.02* 1.13* -- 3.13* --    Estimated Creatinine Clearance: 74.8 ml/min (by C-G formula based on Cr of 0.86).

## 2012-05-06 NOTE — Progress Notes (Signed)
The lexiscan myoview shows reversible anteroapical ischemia and EF is lower (18 %) than by echo but echo probably more accurate in the face of his LBBB with paradoxical septal motion. I talked with him and his family about left heart cath. He is agreeable. Renal function is normal. Will keep NPO after MN tonight.

## 2012-05-07 ENCOUNTER — Encounter (HOSPITAL_COMMUNITY)
Admission: EM | Disposition: A | Discharge status: Home / Self Care | Payer: Self-pay | Source: Home / Self Care | Attending: Internal Medicine

## 2012-05-07 DIAGNOSIS — J810 Acute pulmonary edema: Secondary | ICD-10-CM

## 2012-05-07 DIAGNOSIS — R222 Localized swelling, mass and lump, trunk: Secondary | ICD-10-CM

## 2012-05-07 DIAGNOSIS — I2510 Atherosclerotic heart disease of native coronary artery without angina pectoris: Secondary | ICD-10-CM

## 2012-05-07 DIAGNOSIS — E1165 Type 2 diabetes mellitus with hyperglycemia: Secondary | ICD-10-CM

## 2012-05-07 DIAGNOSIS — I10 Essential (primary) hypertension: Secondary | ICD-10-CM

## 2012-05-07 HISTORY — PX: LEFT HEART CATHETERIZATION WITH CORONARY ANGIOGRAM: SHX5451

## 2012-05-07 LAB — POCT I-STAT 3, ART BLOOD GAS (G3+)
O2 Saturation: 90 %
pCO2 arterial: 40.7 mmHg (ref 35.0–45.0)
pH, Arterial: 7.417 (ref 7.350–7.450)
pO2, Arterial: 58 mmHg — ABNORMAL LOW (ref 80.0–100.0)

## 2012-05-07 LAB — HEPARIN LEVEL (UNFRACTIONATED): Heparin Unfractionated: 0.57 IU/mL (ref 0.30–0.70)

## 2012-05-07 LAB — POCT I-STAT 3, VENOUS BLOOD GAS (G3P V)
Bicarbonate: 28.3 mEq/L — ABNORMAL HIGH (ref 20.0–24.0)
TCO2: 30 mmol/L (ref 0–100)
pCO2, Ven: 47.8 mmHg (ref 45.0–50.0)
pH, Ven: 7.381 — ABNORMAL HIGH (ref 7.250–7.300)
pO2, Ven: 30 mmHg (ref 30.0–45.0)

## 2012-05-07 LAB — BASIC METABOLIC PANEL
BUN: 17 mg/dL (ref 6–23)
CO2: 27 mEq/L (ref 19–32)
Chloride: 98 mEq/L (ref 96–112)
Glucose, Bld: 159 mg/dL — ABNORMAL HIGH (ref 70–99)
Potassium: 3.5 mEq/L (ref 3.5–5.1)
Sodium: 137 mEq/L (ref 135–145)

## 2012-05-07 LAB — GLUCOSE, CAPILLARY
Glucose-Capillary: 196 mg/dL — ABNORMAL HIGH (ref 70–99)
Glucose-Capillary: 47 mg/dL — ABNORMAL LOW (ref 70–99)

## 2012-05-07 SURGERY — LEFT HEART CATHETERIZATION WITH CORONARY ANGIOGRAM
Anesthesia: LOCAL

## 2012-05-07 MED ORDER — ASPIRIN 81 MG PO CHEW
81.0000 mg | CHEWABLE_TABLET | Freq: Every day | ORAL | Status: DC
  Administered 2012-05-08: 81 mg via ORAL
  Filled 2012-05-07: qty 1

## 2012-05-07 MED ORDER — POLYVINYL ALCOHOL 1.4 % OP SOLN
1.0000 [drp] | Freq: Four times a day (QID) | OPHTHALMIC | Status: DC | PRN
  Filled 2012-05-07: qty 15

## 2012-05-07 MED ORDER — LIDOCAINE HCL (PF) 1 % IJ SOLN
INTRAMUSCULAR | Status: AC
  Filled 2012-05-07: qty 30

## 2012-05-07 MED ORDER — ATORVASTATIN CALCIUM 40 MG PO TABS
40.0000 mg | ORAL_TABLET | Freq: Every day | ORAL | Status: DC
  Administered 2012-05-07: 40 mg via ORAL
  Filled 2012-05-07 (×2): qty 1

## 2012-05-07 MED ORDER — MIDAZOLAM HCL 2 MG/2ML IJ SOLN
INTRAMUSCULAR | Status: AC
  Filled 2012-05-07: qty 2

## 2012-05-07 MED ORDER — FAMOTIDINE IN NACL 20-0.9 MG/50ML-% IV SOLN
20.0000 mg | Freq: Once | INTRAVENOUS | Status: AC
  Administered 2012-05-07: 20 mg via INTRAVENOUS

## 2012-05-07 MED ORDER — INSULIN GLARGINE 100 UNIT/ML ~~LOC~~ SOLN: 50.0000 [IU] | Freq: Every morning | SUBCUTANEOUS | Status: DC

## 2012-05-07 MED ORDER — NITROGLYCERIN 0.2 MG/ML ON CALL CATH LAB
INTRAVENOUS | Status: AC
  Filled 2012-05-07: qty 1

## 2012-05-07 MED ORDER — CLOPIDOGREL BISULFATE 75 MG PO TABS
75.0000 mg | ORAL_TABLET | Freq: Every day | ORAL | Status: DC
  Administered 2012-05-08: 75 mg via ORAL
  Filled 2012-05-07: qty 1

## 2012-05-07 MED ORDER — HEPARIN (PORCINE) IN NACL 2-0.9 UNIT/ML-% IJ SOLN
INTRAMUSCULAR | Status: AC
  Filled 2012-05-07: qty 2000

## 2012-05-07 MED ORDER — BIVALIRUDIN 250 MG IV SOLR
INTRAVENOUS | Status: AC
  Filled 2012-05-07: qty 250

## 2012-05-07 MED ORDER — FAMOTIDINE IN NACL 20-0.9 MG/50ML-% IV SOLN
INTRAVENOUS | Status: AC
  Filled 2012-05-07: qty 50

## 2012-05-07 MED ORDER — CLOPIDOGREL BISULFATE 300 MG PO TABS
ORAL_TABLET | ORAL | Status: AC
  Administered 2012-05-08: 75 mg via ORAL
  Filled 2012-05-07: qty 2

## 2012-05-07 MED ORDER — INSULIN GLARGINE 100 UNIT/ML ~~LOC~~ SOLN
45.0000 [IU] | Freq: Every morning | SUBCUTANEOUS | Status: DC
  Administered 2012-05-07 – 2012-05-08 (×2): 45 [IU] via SUBCUTANEOUS

## 2012-05-07 MED ORDER — SODIUM CHLORIDE 0.9 % IV SOLN
INTRAVENOUS | Status: AC
  Administered 2012-05-07: 19:00:00 via INTRAVENOUS

## 2012-05-07 MED ORDER — FENTANYL CITRATE 0.05 MG/ML IJ SOLN
INTRAMUSCULAR | Status: AC
  Filled 2012-05-07: qty 2

## 2012-05-07 NOTE — Progress Notes (Signed)
Site area: right groin  Site Prior to Removal:  Level 0  Pressure Applied For 10 MINUTES    Minutes Beginning at 1735  Manual:   yes  Patient Status During Pull:  AAO X 3  Post Pull Groin Site:  Level 0  Post Pull Instructions Given:  yes  Post Pull Pulses Present:  yes  Dressing Applied:  yes  Comments:  Tolerated procedure well, venous sheath only. Arterial site with angioseal

## 2012-05-07 NOTE — CV Procedure (Signed)
Cardiac Catheterization Operative Report  Hunter Howard [id number] 5/13/20132:40 PM Leo Grosser, MD, MD  Procedure Performed:  1. Left Heart Catheterization 2. Selective Coronary Angiography 3. Right Heart Catheterization 4. PTCA/DES x 1 mid RCA 5. Angioseal femoral artery closure device  Operator: Verne Carrow, MD  Indication:  Cardiomyopathy, ischemia on stress myoview, dyspnea.                               Procedure Details: The risks, benefits, complications, treatment options, and expected outcomes were discussed with the patient. The patient and/or family concurred with the proposed plan, giving informed consent. The patient was brought to the cath lab after IV hydration was begun and oral premedication was given. The patient was further sedated with Versed and Fentanyl. The right groin was prepped and draped in the usual manner. Using the modified Seldinger access technique, a 5 French sheath was placed in the right femoral artery. A 7 French sheath was inserted into the right femoral vein. A balloon tipped catheter was used to perform a right heart catheterization. Standard diagnostic catheters were used to perform selective coronary angiography. A pigtail catheter was used to measure left ventricular pressures. The patient was found to have a severe stenosis in the mid RCA. The sheath was upsized to a 6 [place] system. The patient was given a bolus of Angiomax and a drip was started. He was given Plavix 600 mg po x 1. When the ACT was greater than 200, I engaged the RCA with a 6 [place] JR-4 guiding catheter. A Cougar IC wire was advanced down the RCA. I then used a 2.5 x 12 mm balloon to pre-dilate the stenosis. A 3.5 x 16 mm Promus Element DES was deployed in the mid RCA. A 3.75 x 12 mm [place] balloon was used to post-dilate the stent. There was an excellent angiographic result. The stenosis was taken from 95% down to 0%. The guide and wire were removed. An Angioseal femoral  artery closure device was deployed in the right femoral artery.   There were no immediate complications. The patient was taken to the recovery area in stable condition.   Hemodynamic Findings: Ao:    108/54  LV:  109/5/8 RA:   3          RV:  26/1/0 PA:   26/2 (mean 15)   PCWP:  7 Fick Cardiac Output: 4.37 L/min Fick Cardiac Index:  2.13 L/min/m2 Central Aortic Saturation:  90% Pulmonary Artery Saturation:  56%   Angiographic Findings:  Left main: No obstructive disease.   Left Anterior Descending Artery: Large caliber vessel that courses to the apex. There are mild luminal irregularities in the proximal and mid vessel. The first two diagonal branches are small to moderate sized and have mild plaque disease. The third and fourth diagonal branches are very small and have mild plaque disease.   Circumflex Artery: Moderate sized vessel with early intermediate branch with mild plaque disease. The first OM branch is small to moderate sized with no disease. The second OM is moderate sized and has no disease.   Right Coronary Artery: Large, dominant vessel with 95%, calcified mid stenosis. The remainder of the vessel has no obstructive disease.   Left Ventricular Angiogram: No performed.   Impression: 1. Severe single vessel CAD 2. Successful PTCA/DES x 1 mid RCA 3. Cardiomyopathy, possibly ischemic.   Recommendations: Will need ASA and Plavix for at least one  year. Will also start statin and beta blocker.        Complications:  None; patient tolerated the procedure well.

## 2012-05-07 NOTE — H&P (View-Only) (Signed)
SUBJECTIVE:  Never any chest pain.  States breathing has been good for past two days. Had [employer] earlier today, results pending.    PHYSICAL EXAM Filed Vitals:   05/06/12 0857 05/06/12 0907 05/06/12 0909 05/06/12 0911  BP: 133/74 125/69 136/76 131/74  Pulse: 63 84 75 73  Temp:      TempSrc:      Resp:      Height:      Weight:      SpO2:       General:  No distress HEENT:  PERRL Lungs:  Clear to auscultation. Heart:  RRR Abdomen:  Positive bowel sounds, no rebound no guarding Extremities:  No edema Neuro: Intact.  LABS: Lab Results  Component Value Date   CKTOTAL 68 05/04/2012   CKMB 4.5* 05/04/2012   TROPONINI 1.02* 05/04/2012   Results for orders placed during the hospital encounter of 05/03/12 (from the past 24 hour(s))  CBC     Status: Abnormal   Collection Time   05/05/12 10:55 AM      Component Value Range   WBC 10.1  4.0 - 10.5 (K/uL)   RBC 4.42  4.22 - 5.81 (MIL/uL)   Hemoglobin 13.6  13.0 - 17.0 (g/dL)   HCT 16.1  09.6 - 04.5 (%)   MCV 88.7  78.0 - 100.0 (fL)   MCH 30.8  26.0 - 34.0 (pg)   MCHC 34.7  30.0 - 36.0 (g/dL)   RDW 40.9 (*) 81.1 - 15.5 (%)   Platelets 161  150 - 400 (K/uL)  BASIC METABOLIC PANEL     Status: Abnormal   Collection Time   05/05/12 10:55 AM      Component Value Range   Sodium 135  135 - 145 (mEq/L)   Potassium 4.0  3.5 - 5.1 (mEq/L)   Chloride 97  96 - 112 (mEq/L)   CO2 23  19 - 32 (mEq/L)   Glucose, Bld 297 (*) 70 - 99 (mg/dL)   BUN 21  6 - 23 (mg/dL)   Creatinine, Ser 9.14  0.50 - 1.35 (mg/dL)   Calcium 9.2  8.4 - 78.2 (mg/dL)   GFR calc non Af Amer 79 (*) >90 (mL/min)   GFR calc Af Amer >90  >90 (mL/min)  GLUCOSE, CAPILLARY     Status: Abnormal   Collection Time   05/05/12 12:04 PM      Component Value Range   Glucose-Capillary 285 (*) 70 - 99 (mg/dL)  GLUCOSE, CAPILLARY     Status: Abnormal   Collection Time   05/05/12  4:57 PM      Component Value Range   Glucose-Capillary 187 (*) 70 - 99 (mg/dL)   Comment 1 Documented in Chart     Comment 2 Notify RN    GLUCOSE, CAPILLARY     Status: Abnormal   Collection Time   05/05/12  9:05 PM      Component Value Range   Glucose-Capillary 171 (*) 70 - 99 (mg/dL)   Comment 1 Notify RN    GLUCOSE, CAPILLARY     Status: Abnormal   Collection Time   05/06/12  6:02 AM      Component Value Range   Glucose-Capillary 203 (*) 70 - 99 (mg/dL)  HEPARIN LEVEL (UNFRACTIONATED)     Status: Normal   Collection Time   05/06/12  6:38 AM      Component Value Range   Heparin Unfractionated 0.39  0.30 - 0.70 (IU/mL)  BASIC METABOLIC PANEL  Status: Abnormal   Collection Time   05/06/12  6:38 AM      Component Value Range   Sodium 139  135 - 145 (mEq/L)   Potassium 3.5  3.5 - 5.1 (mEq/L)   Chloride 100  96 - 112 (mEq/L)   CO2 25  19 - 32 (mEq/L)   Glucose, Bld 190 (*) 70 - 99 (mg/dL)   BUN 19  6 - 23 (mg/dL)   Creatinine, Ser 0.86  0.50 - 1.35 (mg/dL)   Calcium 9.3  8.4 - 57.8 (mg/dL)   GFR calc non Af Amer 79 (*) >90 (mL/min)   GFR calc Af Amer >90  >90 (mL/min)    Intake/Output Summary (Last 24 hours) at 05/06/12 1026 Last data filed at 05/06/12 4696  Gross per 24 hour  Intake 1620.05 ml  Output   2630 ml  Net -1009.95 ml   Chest CT:  Left lung base 3.5 x 3.8 cm mass has not cleared and may represent  malignancy rather than infection.  Interval development of pulmonary edema and basilar subsegmental  atelectasis.  No pulmonary embolus.  Tortuous calcified aorta with ascending thoracic aorta measuring up  to 3.8 cm.  Cardiac enlargement most notable left ventricle. Prominent  coronary calcifications.  Top normal sized mediastinal and hilar lymph nodes.  Enhancing lesion peripheral aspect right lobe liver not as well  appreciated on present exam.  ECHO:  - Left ventricle: Wall thickness was increased in a pattern of severe LVH. Systolic function was severely reduced. The estimated ejection fraction was in the range of 25% to 30%. There  was fusion of early and atrial contributions to ventricular filling.  ASSESSMENT AND PLAN:   1) Acute on chronic systolic CHF (congestive heart failure), NYHA class 3: I don't think that the EF represents a significant change.  Change to PO Lasix.     2) HYPERTENSION:  Controlled     3) COPD UNSPECIFIED:  Per primary team.   4) CAD (coronary artery disease):  Lexiscan results pending. If negative, no further cardiac testing planned this admission.   5) Lung mass- Question malignancy.  See CT above.  Plan per primary team as above.  The patient says that he does not want a biopsy or treatment.   Cassell Clement 05/06/2012 10:26 AM

## 2012-05-07 NOTE — Interval H&P Note (Signed)
History and Physical Interval Note:  05/07/2012 1:31 PM  Hunter Howard  has presented today for surgery, with the diagnosis of cp  The various methods of treatment have been discussed with the patient and family. After consideration of risks, benefits and other options for treatment, the patient has consented to  Procedure(s) (LRB): LEFT HEART CATHETERIZATION WITH CORONARY ANGIOGRAM (N/A) as a surgical intervention .  The patients' history has been reviewed, patient examined, no change in status, stable for surgery.  I have reviewed the patients' chart and labs.  Questions were answered to the patient's satisfaction.     [name]

## 2012-05-07 NOTE — Progress Notes (Signed)
Subjective: Asymptomatic. Mouth feels dry. Requesting artificial tears.  Objective: Vital signs in last 24 hours: Temp:  [97 F (36.1 C)-98.8 F (37.1 C)] 98.1 F (36.7 C) (05/13 0514) Pulse Rate:  [65-75] 65  (05/13 1003) Resp:  [18] 18  (05/13 1003) BP: (103-166)/(52-74) 103/64 mmHg (05/13 1003) SpO2:  [92 %-96 %] 92 % (05/13 1003) Weight:  [80.967 kg (178 lb 8 oz)] 80.967 kg (178 lb 8 oz) (05/13 0514) Weight change: -0.68 kg (-1 lb 8 oz) Last BM Date: 05/06/12  Intake/Output from previous day: 05/12 0701 - 05/13 0700 In: 2223 [P.O.:1620; I.V.:603] Out: 3100 [Urine:3100]     Physical Exam: General: Comfortable in chair, , alert, communicative, fully oriented, not short of breath at rest.  HEENT:  No clinical pallor, no jaundice, no conjunctival injection or discharge. Appears euvolemic, clinically. NECK:  Supple, JVP not seen, no carotid bruits, no palpable lymphadenopathy, no palpable goiter. CHEST:  Clinically clear to auscultation, no wheezes, no crackles. HEART:  Sounds 1 and 2 heard, normal, regular, no murmurs. ABDOMEN:  Full, soft, non-tender, no palpable organomegaly, no palpable masses, normal bowel sounds. GENITALIA:  Not examined. LOWER EXTREMITIES:  No pitting edema, palpable peripheral pulses. MUSCULOSKELETAL SYSTEM:  Generalized osteoarthritic changes, otherwise, normal. CENTRAL NERVOUS SYSTEM:  No focal neurologic deficit on gross examination.  Lab Results:  Basename 05/06/12 1655 05/05/12 1055  WBC 8.1 10.1  HGB 13.5 13.6  HCT 38.9* 39.2  PLT 175 161    Basename 05/07/12 0540 05/06/12 1655  NA 137 136  K 3.5 3.8  CL 98 99  CO2 27 24  GLUCOSE 159* 275*  BUN 17 18  CREATININE 0.90 0.80  CALCIUM 9.8 9.4   Recent Results (from the past 240 hour(s))  MRSA PCR SCREENING     Status: Normal   Collection Time   05/04/12  1:30 AM      Component Value Range Status Comment   MRSA by PCR NEGATIVE  NEGATIVE  Final      Studies/Results: Nm Myocar  Multi W/spect W/wall Motion / Ef  05/06/2012  *RADIOLOGY REPORT*  Clinical Data:  CHF, cardiomyopathy and coronary artery disease.  MYOCARDIAL IMAGING WITH SPECT (REST AND PHARMACOLOGIC-STRESS) GATED LEFT VENTRICULAR WALL MOTION STUDY LEFT VENTRICULAR EJECTION FRACTION  Technique:  Standard myocardial SPECT imaging was performed after resting intravenous injection of 11 mCi Tc-80m tetrofosmin. Subsequently, intravenous infusion of Lexiscan was performed under the supervision of the Cardiology staff.  At peak effect of the drug, 33 mCi Tc-42m tetrofosmin was injected intravenously and standard myocardial SPECT  imaging was performed.  Quantitative gated imaging was also performed to evaluate left ventricular wall motion, and estimate left ventricular ejection fraction.  Comparison:  None.  Findings: Utilizing gated data, the end-diastolic volume is estimated to be 295 ml and the end-systolic volume 241 ml. Calculated ejection fraction is 18%.  Gated wall motion analysis demonstrates severe global hypokinesis and a nearly akinetic septum.  SPECT imaging shows evidence of inferior wall scar.  There is diminished perfusion noted to the anteroapical region after Lexiscan administration consistent with ischemia.  IMPRESSION:  1.  Dilated left ventricle with significant global hypokinesis and quantitative ejection fraction of 18%. 2.  Inducible ischemia involving the anteroapical wall. 3.  Fixed scar involving the inferior wall.  Original Report Authenticated By: Reola Calkins, M.D.    Medications: Scheduled Meds:    . antiseptic oral rinse  15 mL Mouth Rinse BID  . aspirin  324 mg Oral Pre-Cath  .  aspirin  325 mg Oral Daily  . atorvastatin  10 mg Oral q1800  . carvedilol  3.125 mg Oral BID WC  . citalopram  40 mg Oral Daily  . diazepam  5 mg Oral Pre-Cath  . ferrous sulfate  325 mg Oral Q breakfast  . furosemide  40 mg Oral BID  . insulin aspart  0-15 Units Subcutaneous TID WC  . insulin aspart  0-5  Units Subcutaneous QHS  . insulin glargine  45 Units Subcutaneous q morning - 10a  . loratadine  10 mg Oral Daily  . losartan  50 mg Oral Daily  . pantoprazole  40 mg Oral Q1200  . potassium chloride  10 mEq Oral Daily  . sodium chloride  3 mL Intravenous Q12H  . DISCONTD: insulin glargine  40 Units Subcutaneous q morning - 10a  . DISCONTD: insulin glargine  45 Units Subcutaneous q morning - 10a  . DISCONTD: insulin glargine  50 Units Subcutaneous q morning - 10a   Continuous Infusions:    . sodium chloride 10 mL/hr (05/05/12 0810)  . sodium chloride    . heparin 1,650 Units/hr (05/07/12 0751)   PRN Meds:.sodium chloride, acetaminophen, acetaminophen, albuterol, diphenhydrAMINE, ipratropium, nitroGLYCERIN, ondansetron (ZOFRAN) IV, ondansetron, sodium chloride  Assessment/Plan:  1. Acute on chronic systolic CHF/acute respiratory failure needing Bipap: Patient presented with acute dyspnea, and CXR findings, consistent with decompensated CHF. 2D echocardiogram showed ejection fraction of 25% to 30%. He was managed with intravenous Lasix, with satisfactory clinical response. Cardiology consultation was provided by Dr Aldona Bar, who was very helpful in optimizing heat failure treatment. Patient has been transitioned to oral Lasix, as of 05/06/2012. Patient did have acute hypoxic respiratory failure at presentation, requiring oxygen supplemenation and BIPAP. This was felt to be multifactorial, due to CHF, underlying COPD, and known lung mass. Fortunately, CTA of 05/04/12, showed no evidence of pulmonary embolism. As of 05/06/12, patient is saturating at 91%-95% on room air. 2. Lung mass- left: Patent has a known basilar 3.5 x 3.8 cm left lung mass, concerning for malignancy. Patient has consistently declined any further work up. 3. HYPERTENSION:   Blood pressure is controlled on Cozaar, Coreg and diuretics.   4. COPD UNSPECIFIED: No evidence of exacerbation, and appears to be stable at this  time.  Patient is on Albuterol nebs  5. CAD (coronary artery disease) with elevated troponins  Patient has a cardiomyopathy, and at the present time, this is thought to be possibly ischemic, given his obvious cardiovascular risk factors. Stress Myoview of 05/06/12 showed EF of 18%, global hypokinesis, with reversible antero/apical ischemia. Cardiac catheterization is scheduled for 05/07/12. Continued on beta blocker, statin and Aspirin  6. Diabetes mellitus:  This is sub-optimally controlled at this time. Managing with diet, SSI and Lantus. Titrating as indicted.  Glucophage was discontinued, in view of CHF decompensation.  7. GERD (gastroesophageal reflux disease):  Asymptomatic on PPI.  Comment: Disposition is per cardiology.     LOS: 4 days   Benjy Kana,CHRISTOPHER 05/07/2012, 10:12 AM

## 2012-05-07 NOTE — Discharge Summary (Signed)
Physician Discharge Summary  Patient ID: Hunter Howard MRN: [id number] DOB/AGE: [birth year] 78 y.o.  Admit date: 05/03/2012 Discharge date: 05/08/2012  Primary Care Physician:  Leo Grosser, MD, MD   Discharge Diagnoses:    Patient Active Problem List  Diagnoses  . HYPERLIPIDEMIA  . HYPERTENSION  . COPD UNSPECIFIED  . DYSPNEA  . LBBB (left bundle branch block)  . CAD (coronary artery disease)  . Acute on chronic systolic CHF (congestive heart failure), NYHA class 3  . Microcytic anemia  . Dyspnea  . Hearing loss  . Lung mass- left  . Acute respiratory failure with hypoxia  . GERD (gastroesophageal reflux disease)  . Diabetes mellitus    Medication List  As of 05/08/2012 10:01 AM   STOP taking these medications         aspirin 325 MG tablet      metFORMIN 850 MG tablet         TAKE these medications         aspirin 81 MG chewable tablet   Chew 1 tablet (81 mg total) by mouth daily.      carvedilol 3.125 MG tablet   Commonly known as: COREG   Take 3.125 mg by mouth 2 (two) times daily with a meal.      cetirizine 10 MG tablet   Commonly known as: ZYRTEC   Take 10 mg by mouth daily.      citalopram 40 MG tablet   Commonly known as: CELEXA   Take 40 mg by mouth daily.      clopidogrel 75 MG tablet   Commonly known as: PLAVIX   Take 1 tablet (75 mg total) by mouth daily with breakfast.      ferrous sulfate 325 (65 FE) MG tablet   Take 325 mg by mouth daily with breakfast.      furosemide 40 MG tablet   Commonly known as: LASIX   Take 1 tablet (40 mg total) by mouth 2 (two) times daily.      HUMULIN R 100 units/mL injection   Generic drug: insulin regular   Inject 10 Units into the skin 4 (four) times daily as needed.      insulin glargine 100 UNIT/ML injection   Commonly known as: LANTUS   Inject 40 Units into the skin every morning.      losartan 50 MG tablet   Commonly known as: COZAAR   Take 50 mg by mouth daily.      MEDI-MECLIZINE 25 MG  tablet   Generic drug: meclizine   Take 25 mg by mouth 3 (three) times daily as needed. For dizziness.      nitroGLYCERIN 0.4 MG SL tablet   Commonly known as: NITROSTAT   Place 0.4 mg under the tongue every 5 (five) minutes as needed. For chest pain.      omeprazole 40 MG capsule   Commonly known as: PRILOSEC   Take 40 mg by mouth daily.      potassium chloride 10 MEQ tablet   Commonly known as: K-DUR   Take 2 tablets (20 mEq total) by mouth daily.      rosuvastatin 10 MG tablet   Commonly known as: CRESTOR   Take 2 tablets (20 mg total) by mouth daily.             Disposition and Follow-up:  Follow up with primary MD, and with [facility].   Consults:  cardiology  Dr Aldona Bar, Cardiologist.   Significant Diagnostic Studies:  Ct Angio Chest W/cm &/or Wo Cm  05/04/2012  *RADIOLOGY REPORT*  Clinical Data: Low back pain.  Cough.  Shortness breath.  CT ANGIOGRAPHY CHEST  Technique:  Multidetector CT imaging of the chest using the standard protocol during bolus administration of intravenous contrast. Multiplanar reconstructed images including MIPs were obtained and reviewed to evaluate the vascular anatomy.  Contrast:  100 ml Omnipaque-350.  Comparison: 03/13/2012.  Findings: Left lung base 3.5 x 3.8 cm mass has not cleared and may represent malignancy rather than infection.  Interval development of pulmonary edema and basilar subsegmental atelectasis.  No pulmonary embolus.  Tortuous calcified aorta with ascending thoracic aorta measuring up to 3.8 cm.  Cardiac enlargement most notable left ventricle.  Prominent coronary calcifications.  Enhancing lesion peripheral aspect right lobe liver not as well appreciated on present exam.  Thoracic kyphosis without bony destructive lesion.  IMPRESSION:  Left lung base 3.5 x 3.8 cm mass has not cleared and may represent malignancy rather than infection.  Interval development of pulmonary edema and basilar subsegmental atelectasis.  No  pulmonary embolus.  Tortuous calcified aorta with ascending thoracic aorta measuring up to 3.8 cm.  Cardiac enlargement most notable left ventricle.  Prominent coronary calcifications.  Top normal sized mediastinal and hilar lymph nodes.  Enhancing lesion peripheral aspect right lobe liver not as well appreciated on present exam.  Original Report Authenticated By: Fuller Canada, M.D.   Dg Chest Port 1 View  05/03/2012  *RADIOLOGY REPORT*  Clinical Data: Short of breath.  Respiratory failure.  PORTABLE CHEST - 1 VIEW  Comparison: 03/13/2012.  Findings: Monitoring leads are projected over the chest.  Perihilar and basilar predominant airspace disease is present, most compatible with pulmonary edema.  This appears symmetric.  No definite pleural effusions.  Cardiopericardial silhouette is within normal limits for projection.  The cephalization of pulmonary blood flow is noted.  IMPRESSION: Findings most compatible with moderate CHF.  Original Report Authenticated By: Andreas Newport, M.D.    Brief H and P: For complete details, refer to admission H and P. However, in brief, this is an 78 year old male, with known history of DM 2, HTN, Dyslipidemia, GERD, CAD, PVD, COPD, depression, left lower lobe lung mass, LV dysfunction, with EF 35%, who was having an argument with an insurance agent over the phone, when he got suddenly short of breath and had to call the EMS. He was administered Lasix 80 mg iv, was  Initially placed on BiPAP, then transitioned to 100% nonrebreather. He was admitted for further evaluation, investigation and management.  Physical Exam: On 5/14/12/2011. General: Comfortable in chair, alert, communicative, fully oriented, not short of breath at rest.  HEENT: No clinical pallor, no jaundice, no conjunctival injection or discharge. Appears euvolemic, clinically.  NECK: Supple, JVP not seen, no carotid bruits, no palpable lymphadenopathy, no palpable goiter.  CHEST: Clinically clear to  auscultation, no wheezes, no crackles.  HEART: Sounds 1 and 2 heard, normal, regular, no murmurs.  ABDOMEN: Full, soft, non-tender, no palpable organomegaly, no palpable masses, normal bowel sounds.  GENITALIA: Not examined.  LOWER EXTREMITIES: No pitting edema, palpable peripheral pulses.  MUSCULOSKELETAL SYSTEM: Generalized osteoarthritic changes, otherwise, normal.  CENTRAL NERVOUS SYSTEM: No focal neurologic deficit on gross examination.   Hospital Course:  1. Acute on chronic systolic CHF/acute respiratory failure needing Bipap:  Patient presented with acute dyspnea, and CXR findings, consistent with decompensated CHF. 2D echocardiogram showed ejection fraction of 25% to 30%. He was managed with intravenous Lasix, with satisfactory clinical  response. Cardiology consultation was provided by Dr Aldona Bar, who was very helpful in optimizing heat failure treatment. Patient has been transitioned to oral Lasix, as of 05/06/2012. Patient did have acute hypoxic respiratory failure at presentation, requiring oxygen supplemenation and BIPAP. This was felt to be multifactorial, due to CHF, underlying COPD, and known lung mass. Fortunately, CTA of 05/04/12, showed no evidence of pulmonary embolism. As of 05/06/12, patient was saturating at 91%-95% on room air.  2. Lung mass- left:  Patent has a known left basilar lung mass, noted on chest CT scan on 01/27/2012, at which time it measured 3.0 x 3.3 x 3.7 cm, concerning for malignancy. He has consistently declined any further work up. The mass now measures 3.5 x 3.8 cm, per CTA of 05/04/12. 3. HYPERTENSION:  Blood pressure is controlled on Cozaar, beta-blocker and diuretics.  4. COPD UNSPECIFIED:  No evidence of exacerbation during this hospitalization, and appears to be stable at this time.  Patient was managed with bronchodilators.  5. CAD (coronary artery disease) with elevated troponins  Patient has a cardiomyopathy, and this is thought to be ischemic,  given his obvious cardiovascular risk factors. Stress Myoview of 05/06/12 showed EF of 18%, global hypokinesis, with reversible antero/apical ischemia. Cardiac catheterization was carried out on 05/07/12, by Dr Melene Muller, and this revealed a large, dominant RCA with 95%, calcified mid stenosis, which was successfully addressed with PTCA and deployment of a DES. The remainder of the vessel had no obstructive disease. Patient was continued on beta blocker, statin, Aspirin and Plavix added to treatment. The recommendation, is to continue Aspirin/Plavix for at least one year 6. Diabetes mellitus:  This was controlled with diet, SSI and Lantus. Titrating as indicted.  Glucophage was discontinued, in view of CHF decompensation.  7. GERD (gastroesophageal reflux disease):  Asymptomatic on PPI.   Comment: Stable for discharge on 05/08/12.   Time spent on Discharge: 35 mins.  Signed: Kriya Westra,CHRISTOPHER 05/08/2012, 10:01 AM

## 2012-05-08 DIAGNOSIS — E1165 Type 2 diabetes mellitus with hyperglycemia: Secondary | ICD-10-CM

## 2012-05-08 DIAGNOSIS — I10 Essential (primary) hypertension: Secondary | ICD-10-CM

## 2012-05-08 DIAGNOSIS — R222 Localized swelling, mass and lump, trunk: Secondary | ICD-10-CM

## 2012-05-08 DIAGNOSIS — J810 Acute pulmonary edema: Secondary | ICD-10-CM

## 2012-05-08 LAB — CBC
HCT: 41.2 % (ref 39.0–52.0)
Hemoglobin: 14.6 g/dL (ref 13.0–17.0)
MCH: 31.7 pg (ref 26.0–34.0)
MCV: 89.6 fL (ref 78.0–100.0)
RBC: 4.6 MIL/uL (ref 4.22–5.81)

## 2012-05-08 LAB — BASIC METABOLIC PANEL
BUN: 16 mg/dL (ref 6–23)
CO2: 28 mEq/L (ref 19–32)
Calcium: 9.8 mg/dL (ref 8.4–10.5)
Chloride: 97 mEq/L (ref 96–112)
Creatinine, Ser: 0.93 mg/dL (ref 0.50–1.35)
Glucose, Bld: 179 mg/dL — ABNORMAL HIGH (ref 70–99)

## 2012-05-08 LAB — GLUCOSE, CAPILLARY: Glucose-Capillary: 221 mg/dL — ABNORMAL HIGH (ref 70–99)

## 2012-05-08 MED ORDER — FUROSEMIDE 40 MG PO TABS: 40.0000 mg | ORAL_TABLET | Freq: Two times a day (BID) | ORAL | Status: DC

## 2012-05-08 MED ORDER — CLOPIDOGREL BISULFATE 75 MG PO TABS: 75.0000 mg | ORAL_TABLET | Freq: Every day | ORAL | Status: DC

## 2012-05-08 MED ORDER — ASPIRIN 81 MG PO CHEW: 81.0000 mg | CHEWABLE_TABLET | Freq: Every day | ORAL | Status: AC

## 2012-05-08 MED ORDER — POTASSIUM CHLORIDE ER 10 MEQ PO TBCR: 20.0000 meq | EXTENDED_RELEASE_TABLET | Freq: Every day | ORAL | Status: DC

## 2012-05-08 MED ORDER — ROSUVASTATIN CALCIUM 10 MG PO TABS: 20.0000 mg | ORAL_TABLET | Freq: Every day | ORAL | Status: DC

## 2012-05-08 MED FILL — Dextrose Inj 5%: INTRAVENOUS | Qty: 50 | Status: AC

## 2012-05-08 NOTE — Discharge Instructions (Addendum)
Coronary Angiography with Stent This is a procedure to widen or open a narrow blood vessel of the heart (coronary artery). When a coronary artery becomes partially blocked it decreases blood flow to that area. This may lead to chest pain or a heart attack (myocardial infarction). Arteries may become blocked by cholesterol buildup (plaque) in the lining or wall. A stent is a small piece of metal that looks like a mesh or a spring. Stent placement may be done right after an angiogram that finds a blocked artery or as a treatment for a heart attack. RISKS AND COMPLICATIONS  Damage to the heart.   A blockage may return.   Bleeding at the site.   Blood clot to another part of the body.  PROCEDURE  You may be given a medication to help you relax before and during the procedure through an IV in your hand or arm.   A local anesthetic to make the area numb may be used before inserting the catheter (a long, hollow tube about the size of a piece of cooked spaghetti).   You will be prepared for the procedure by washing and shaving the area where the catheter will be inserted. This is usually done in the groin.   A specially trained doctor will insert the catheter with a guide wire into an artery. This is guided under a special type of X-ray (fluoroscopy) to the opening of the blocked artery.   Special dye is then injected and X-rays are taken.   A tiny wire is guided to the blocked spot and a balloon is inflated to make the artery wider. The stent is expanded and crushes the plaque into the wall of the vessel. The stent holds the area open like a scaffolding and improves the blood flow.   Sometimes the artery may be made wider using a laser or other tools to remove plaque.   When the blood flow is better, the catheter is removed. The lining of the artery will grow over the stent which stays where it was placed.  AFTER THE PROCEDURE  You will stay in bed for several hours.   The access site will  be watched and you will be checked frequently.   Blood tests, X-rays and an EKG may be done.   You may stay in the hospital overnight for observation.  SEEK IMMEDIATE MEDICAL CARE IF:   You develop chest pain, shortness of breath, feel faint, or pass out.   There is bleeding, swelling, or drainage from the catheter insertion site.   You develop pain, discoloration, coldness, or severe bruising in the leg or arm that held the catheter.   You see blood in your urine or stool. This may be bright red blood in urine or stools, or also appear as black, tarry stools.   You have a fever.  Document Released: 06/18/2003 Document Revised: 12/01/2011 Document Reviewed: 02/08/2008 [facility] Patient Information 2012 [place], [place].Groin Site Care Refer to this sheet in the next few weeks. These instructions provide you with information on caring for yourself after your procedure. Your caregiver may also give you more specific instructions. Your treatment has been planned according to current medical practices, but problems sometimes occur. Call your caregiver if you have any problems or questions after your procedure. HOME CARE INSTRUCTIONS  You may shower 24 hours after the procedure. Remove the bandage (dressing) and gently wash the site with plain soap and water. Gently pat the site dry.   Do not apply powder  or lotion to the site.   Do not sit in a bathtub, swimming pool, or whirlpool for 5 to 7 days.   No bending, squatting, or lifting anything over 10 pounds (4.5 kg) as directed by your caregiver.   Inspect the site at least twice daily.   Do not drive home if you are discharged the same day of the procedure. Have someone else drive you.   You may drive 24 hours after the procedure unless otherwise instructed by your caregiver.  What to expect:  Any bruising will usually fade within 1 to 2 weeks.   Blood that collects in the tissue (hematoma) may be painful to the touch. It should  usually decrease in size and tenderness within 1 to 2 weeks.  SEEK IMMEDIATE MEDICAL CARE IF:  You have unusual pain at the groin site or down the affected leg.   You have redness, warmth, swelling, or pain at the groin site.   You have drainage (other than a small amount of blood on the dressing).   You have chills.   You have a fever or persistent symptoms for more than 72 hours.   You have a fever and your symptoms suddenly get worse.   Your leg becomes pale, cool, tingly, or numb.   You have heavy bleeding from the site. Hold pressure on the site.  Document Released: 01/14/2011 Document Revised: 12/01/2011 Document Reviewed: 01/14/2011 [facility] Patient Information 2012 [place], [place].

## 2012-05-08 NOTE — Progress Notes (Signed)
SUBJECTIVE: No chest pain or SOB. No events.   BP 125/77  Pulse 80  Temp(Src) 97.6 F (36.4 C) (Oral)  Resp 22  Ht 6\' 1"  (1.854 m)  Wt 178 lb 8 oz (80.967 kg)  BMI 23.55 kg/m2  SpO2 92%  Intake/Output Summary (Last 24 hours) at 05/08/12 1610 Last data filed at 05/08/12 0600  Gross per 24 hour  Intake 1034.17 ml  Output   2650 ml  Net -1615.83 ml    PHYSICAL EXAM General: Well developed, well nourished, in no acute distress. Alert and oriented x 3.  Psych:  Good affect, responds appropriately Neck: No JVD. No masses noted.  Lungs: Clear bilaterally with no wheezes or rhonci noted.  Heart: RRR with no murmurs noted. Abdomen: Bowel sounds are present. Soft, non-tender.  Extremities: No lower extremity edema. Right groin without hematoma.   LABS: Basic Metabolic Panel:  Basename 05/08/12 0450 05/07/12 0540  NA 137 137  K 3.9 3.5  CL 97 98  CO2 28 27  GLUCOSE 179* 159*  BUN 16 17  CREATININE 0.93 0.90  CALCIUM 9.8 9.8  MG -- --  PHOS -- --   CBC:  Basename 05/08/12 0450 05/06/12 1655  WBC 8.8 8.1  NEUTROABS -- --  HGB 14.6 13.5  HCT 41.2 38.9*  MCV 89.6 89.4  PLT 177 175   Current Meds:    . antiseptic oral rinse  15 mL Mouth Rinse BID  . aspirin  81 mg Oral Daily  . atorvastatin  40 mg Oral q1800  . bivalirudin      . carvedilol  3.125 mg Oral BID WC  . citalopram  40 mg Oral Daily  . clopidogrel      . clopidogrel  75 mg Oral Q breakfast  . diazepam  5 mg Oral Pre-Cath  . famotidine      . famotidine  20 mg Intravenous Once  . fentaNYL      . ferrous sulfate  325 mg Oral Q breakfast  . furosemide  40 mg Oral BID  . heparin      . insulin aspart  0-15 Units Subcutaneous TID WC  . insulin aspart  0-5 Units Subcutaneous QHS  . insulin glargine  45 Units Subcutaneous q morning - 10a  . lidocaine      . loratadine  10 mg Oral Daily  . losartan  50 mg Oral Daily  . midazolam      . nitroGLYCERIN      . pantoprazole  40 mg Oral Q1200  .  potassium chloride  10 mEq Oral Daily  . DISCONTD: aspirin  325 mg Oral Daily  . DISCONTD: atorvastatin  10 mg Oral q1800  . DISCONTD: insulin glargine  45 Units Subcutaneous q morning - 10a  . DISCONTD: insulin glargine  50 Units Subcutaneous q morning - 10a  . DISCONTD: sodium chloride  3 mL Intravenous Q12H     ASSESSMENT AND PLAN:  1. CAD: s/p PTCA/DES x 1 RCA yesterday. LAD and Circumflex have minimal disease. Doing well. He will need one year of dual antiplatelet therapy with ASA 81 mg po Qdaily and Plavix 75 mg po Qdaily. Will continue statin and beta blocker.   2. Secondary Cardiomyopathy: There may be an ischemic component. Will continue with medical therapy now post revascularization including ARB, beta blocker, Lasix.   3. LBBB: chronic.   OK for discharge from cardiac perspective. He can follow up with Dr. Eden Emms in our [address]  office in 3 weeks. He has been followed by Dr. Eden Emms in the office in the past.     [facility]  5/14/20137:14 AM

## 2012-05-08 NOTE — Progress Notes (Signed)
CARDIAC REHAB PHASE I   PRE:  Rate/Rhythm: 102ST  BP:  Supine:   Sitting: 111/72  Standing:    SaO2: 92%RA  MODE:  Ambulation: 300 ft   POST:  Rate/Rhythem: 108ST  BP:  Supine:   Sitting: 125/70  Standing:    SaO2: 95%RA 0740-0835 Pt walked 300 ft on RA with asst x 1 with fairly steady gait. Uses cane or walker at home as needed. Denied chest pain. Did not c/o SOB. Sats good on RA. Tolerated well and stated walk felt good. To recliner after walk. Ed completed. Discussed importance of daily weights and watching sodium. Gave CHF packet and reviewed zones. Declined CRP 2 due to not wanting to drive that distance and will ex on is own.  Hunter Howard

## 2012-05-08 NOTE — Progress Notes (Signed)
Pt will follow up with Dr Eden Emms on June 08, 2012 @ 10 am. Post hospital follow up per Dr Clifton James.

## 2012-06-08 ENCOUNTER — Ambulatory Visit (INDEPENDENT_AMBULATORY_CARE_PROVIDER_SITE_OTHER): Payer: Medicare PPO | Admitting: Cardiovascular Disease

## 2012-06-08 VITALS — BP 116/64 | HR 80 | Ht 73.0 in | Wt 190.0 lb

## 2012-06-08 DIAGNOSIS — E785 Hyperlipidemia, unspecified: Secondary | ICD-10-CM

## 2012-06-08 DIAGNOSIS — I447 Left bundle-branch block, unspecified: Secondary | ICD-10-CM

## 2012-06-08 DIAGNOSIS — R0609 Other forms of dyspnea: Secondary | ICD-10-CM

## 2012-06-08 DIAGNOSIS — I10 Essential (primary) hypertension: Secondary | ICD-10-CM

## 2012-06-08 DIAGNOSIS — I2510 Atherosclerotic heart disease of native coronary artery without angina pectoris: Secondary | ICD-10-CM

## 2012-06-08 MED ORDER — CLOPIDOGREL BISULFATE 75 MG PO TABS: 75.0000 mg | ORAL_TABLET | Freq: Every day | ORAL | Status: DC

## 2012-06-08 NOTE — Patient Instructions (Addendum)
Your physician recommends that you schedule a follow-up appointment in: 3 MONTHS WITH DR [facility] Your physician has recommended you make the following change in your medication: ADD EFFIENT  60 MG  TODAY THEN 10 MG EVERY DAY   TAKE UNTIL RUN OUT THEN START PLAVIX 75 MG EVERY DAY

## 2012-06-08 NOTE — Assessment & Plan Note (Addendum)
Well controlled.  Continue current medications and low sodium Dash type diet.    

## 2012-06-08 NOTE — Assessment & Plan Note (Signed)
Cholesterol is at goal.  Continue current dose of statin and diet Rx.  No myalgias or side effects.  F/U  LFT's in 6 months. No results found for this basename: LDLCALC             

## 2012-06-08 NOTE — Assessment & Plan Note (Signed)
Samples of Effient given To load with 60 mg and then finish samples  Script for plavix called in

## 2012-06-08 NOTE — Progress Notes (Signed)
Patient ID: Hunter Howard, male   DOB: 10-Jul-1927, 78 y.o.   MRN: [id number] 78 yo seen by Dr Patty Sermons in hospital Ischemic DCM EF 22%  LLL lung mass likely cancer.  Patient does not want w/u of this.  Had PCI/stent to RCA.  Felt well since D/C with No dyspnea or chest pain.  Not taking Plavix !!  Restarted metformin.  Compliant with meds otherwise.  Reviewed his angio and LV dysfunciton dysprop to CAD.  Nice result with stent but concerning that he has not taken plavix since D/C.  Confirmed with patient that he does not want w/u for lung CA.  No cough sputum or hemoptysis  ROS: Denies fever, malais, weight loss, blurry vision, decreased visual acuity, cough, sputum, SOB, hemoptysis, pleuritic pain, palpitaitons, heartburn, abdominal pain, melena, lower extremity edema, claudication, or rash.  All other systems reviewed and negative  General: Affect appropriate Healthy:  appears stated age HEENT: normal Neck supple with no adenopathy JVP normal no bruits no thyromegaly Lungs clear with no wheezing and good diaphragmatic motion Heart:  S1/S2 no murmur, no rub, gallop or click PMI normal Abdomen: benighn, BS positve, no tenderness, no AAA no bruit.  No HSM or HJR Distal pulses intact with no bruits No edema Neuro non-focal Skin warm and dry No muscular weakness   Current Outpatient Prescriptions  Medication Sig Dispense Refill  . aspirin 81 MG chewable tablet Chew 1 tablet (81 mg total) by mouth daily.  100 tablet  0  . carvedilol (COREG) 3.125 MG tablet Take 3.125 mg by mouth 2 (two) times daily with a meal.      . cetirizine (ZYRTEC) 10 MG tablet Take 10 mg by mouth daily.        . citalopram (CELEXA) 40 MG tablet Take 40 mg by mouth daily.      . ferrous sulfate 325 (65 FE) MG tablet Take 325 mg by mouth daily with breakfast.      . furosemide (LASIX) 20 MG tablet Take 20 mg by mouth daily.      . insulin glargine (LANTUS) 100 UNIT/ML injection Inject 40 Units into the skin every morning.        . insulin regular (HUMULIN R) 100 UNIT/ML injection Inject 10 Units into the skin 4 (four) times daily as needed.       Marland Kitchen losartan (COZAAR) 100 MG tablet Take 50 mg by mouth daily.      . meclizine (MEDI-MECLIZINE) 25 MG tablet Take 25 mg by mouth 3 (three) times daily as needed. For dizziness.      . metFORMIN (GLUCOPHAGE) 850 MG tablet Take 850 mg by mouth 2 (two) times daily with a meal.      . nitroGLYCERIN (NITROSTAT) 0.4 MG SL tablet Place 0.4 mg under the tongue every 5 (five) minutes as needed. For chest pain.      Marland Kitchen omeprazole (PRILOSEC) 40 MG capsule Take 40 mg by mouth daily.        . potassium chloride (K-DUR) 10 MEQ tablet Take 10 mEq by mouth daily.      . rosuvastatin (CRESTOR) 10 MG tablet Take 5 mg by mouth daily.      Marland Kitchen DISCONTD: rosuvastatin (CRESTOR) 10 MG tablet Take 2 tablets (20 mg total) by mouth daily.  60 tablet  0  . furosemide (LASIX) 40 MG tablet Take 40 mg by mouth daily.        Allergies  Review of patient's allergies indicates no known allergies.  Electrocardiogram:  5/24  NSR LBBB no VT  Assessment and Plan

## 2012-06-08 NOTE — Assessment & Plan Note (Signed)
Stable with no high grade heart block  Not a candidate for AICD with age and likely lung CA

## 2012-06-08 NOTE — Assessment & Plan Note (Signed)
COPD with previous smoking  LLL lung mass likely lung CA  NO w/u per patient wishes  Ischemic DCM euvolemic

## 2012-06-09 ENCOUNTER — Encounter: Payer: Self-pay | Admitting: Cardiovascular Disease

## 2012-08-02 ENCOUNTER — Encounter: Payer: Self-pay | Admitting: Pulmonary Disease

## 2012-08-03 ENCOUNTER — Encounter: Payer: Self-pay | Admitting: Pulmonary Disease

## 2012-08-03 ENCOUNTER — Ambulatory Visit (INDEPENDENT_AMBULATORY_CARE_PROVIDER_SITE_OTHER): Payer: Medicare PPO | Admitting: Pulmonary Disease

## 2012-08-03 VITALS — BP 120/62 | HR 81 | Temp 98.4°F | Ht 73.5 in | Wt 193.0 lb

## 2012-08-03 DIAGNOSIS — R918 Other nonspecific abnormal finding of lung field: Secondary | ICD-10-CM

## 2012-08-03 DIAGNOSIS — R222 Localized swelling, mass and lump, trunk: Secondary | ICD-10-CM

## 2012-08-03 NOTE — Patient Instructions (Signed)
PET/ CT Follow up appointment same day

## 2012-08-04 NOTE — Progress Notes (Signed)
Name: Hunter Howard MRN: [id number] DOB: Aug 03, 1927  Referring Physician:  Lynnea Ferrier Reason for Consultation:  Lung mass   INTERVENTIONAL PULMONOLOGY CONSULTATION NOTE   History of Present Illness: 78 yo former smoker with COPD and multiple other comorbidities referred for further evaluation of the left lung mass.  Reportedly it was first noted on CXR performed 11/20/2011, when it was considered to reflect atelectasis / pneumonia.  Subsequent chest CT performed on 02/02/2012 reported solid opacity in the anterior basal left lower lobe worrisome for lung neoplasm.  The most recent CT dated 05/04/2012 demonstrated persistence of the mass.  Apparently, Hunter Howard was under the impression that surgical biopsy is the only option available to establish tissue diagnosis and was adamantly against any surgical interventions.  Recently he learned that the mass can be biopsied by less invasive means and presented her to discuss his options.  He wants to make it very clear though that neither he would be interested in surgical resection nor he would accept chemotherapy for systemic disease.  He would potentially be interested in SBRT if it is indicated.  Today he reports his dyspnea on exertion to be at baseline.  There is nonproductive cough, but no sputum production, weight loss or hemoptysis.  Past Medical History  Diagnosis Date  . Diabetes mellitus   . Hyperlipidemia   . 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 Shortness of breath   . Blood transfusion   . GERD (gastroesophageal reflux disease)   . Coronary artery disease   . CHF (congestive heart failure)   . Dysrhythmia   . Peripheral vascular disease   . Anemia   . Depression   . COPD (chronic obstructive pulmonary disease)     PFT's 04/15/10  FEV1 2.50 (92%) ratio 68 DLC0 78%  . Lung mass     left lower   Past Surgical History  Procedure Date  . Cataract extraction   . Colonoscopy 2012  .  Enteroscopy 12/16/2011    Procedure: ENTEROSCOPY;  Surgeon: Theda Belfast, MD;  Location: [facility];  Service: Endoscopy;  Laterality: N/A;  . Coronary angioplasty with stent placement   . Mastectomy     Had when 6months old  . Tonsillectomy    Prior to Admission medications   Medication Sig Start Date End Date Taking? Authorizing Provider  acetaminophen-codeine (TYLENOL #3) 300-30 MG per tablet Take 1 tablet by mouth as needed.   Yes Historical Provider, MD  aspirin 81 MG chewable tablet Chew 1 tablet (81 mg total) by mouth daily. 05/08/12 05/08/13 Yes Laveda Norman, MD  atorvastatin (LIPITOR) 40 MG tablet Take 20 mg by mouth daily.   Yes Historical Provider, MD  carvedilol (COREG) 3.125 MG tablet Take 3.125 mg by mouth 2 (two) times daily with a meal. 10/03/11 10/02/12 Yes Wendall Stade, MD  cetirizine (ZYRTEC) 10 MG tablet Take 10 mg by mouth daily.     Yes Historical Provider, MD  citalopram (CELEXA) 40 MG tablet Take 40 mg by mouth daily. 11/21/11  Yes Hollice Espy, MD  clopidogrel (PLAVIX) 75 MG tablet Take 75 mg by mouth daily.   Yes Historical Provider, MD  cyclobenzaprine (FLEXERIL) 10 MG tablet Take 10 mg by mouth as needed.   Yes Historical Provider, MD  ferrous sulfate 325 (65 FE) MG tablet Take 325 mg by mouth daily with breakfast.   Yes Historical Provider, MD  furosemide (LASIX) 20  MG tablet Take 20 mg by mouth daily.   Yes Historical Provider, MD  insulin glargine (LANTUS) 100 UNIT/ML injection Inject 45 Units into the skin daily.    Yes Historical Provider, MD  insulin regular (HUMULIN R) 100 UNIT/ML injection Inject 16 Units into the skin 4 (four) times daily.    Yes Historical Provider, MD  losartan (COZAAR) 100 MG tablet Take 50 mg by mouth daily.   Yes Historical Provider, MD  meclizine (MEDI-MECLIZINE) 25 MG tablet Take 25 mg by mouth 3 (three) times daily as needed. For dizziness.   Yes Historical Provider, MD  metFORMIN (GLUCOPHAGE) 850 MG tablet Take 850 mg by  mouth 3 (three) times daily.    Yes Historical Provider, MD  nitroGLYCERIN (NITROSTAT) 0.4 MG SL tablet Place 0.4 mg under the tongue every 5 (five) minutes as needed. For chest pain.   Yes Historical Provider, MD  omeprazole (PRILOSEC) 40 MG capsule Take 40 mg by mouth daily.     Yes Historical Provider, MD  potassium chloride (K-DUR) 10 MEQ tablet Take 10 mEq by mouth daily.   Yes Historical Provider, MD  zolpidem (AMBIEN) 10 MG tablet Take 10 mg by mouth at bedtime as needed.   Yes Historical Provider, MD   Allergies No Known Allergies  Family History Lung disease: Malignancies:  Mother - breast cancer, brother - lung cancer, colon cancer  Social History Smoking: 1ppd x 25 years = 25 pack years, quit 1983 Occupational exposure:  None  Review Of Systems: Constitutional: Negative for fever, chills, weight loss, malaise/fatigue and diaphoresis.  HENT: Negative for hearing loss, ear pain, nosebleeds, congestion, neck pain, tinnitus and ear discharge.  Positive for sore throat. Eyes: Negative for blurred vision, double vision, photophobia, pain, discharge and redness.  Respiratory: Negative for hemoptysis, sputum production, wheezing and stridor.  Positive for shortness of breath on exertion and dry cough. Cardiovascular: Negative for chest pain, palpitations, orthopnea, claudication, leg swelling and PND.  Gastrointestinal: Negative for nausea, vomiting, abdominal pain, diarrhea, constipation, blood in stool and melena. Positive for indigestion and difficulty swallowing. Genitourinary: Negative for dysuria, urgency, frequency, hematuria and flank pain.  Musculoskeletal: Negative for myalgias, back pain, and falls. Positive for neck pain and joints stiffness. Skin: Negative for itching and rash.  Neurological: Negative for dizziness, tingling, tremors, sensory change, speech change, focal weakness, seizures, loss of consciousness, weakness and headaches.  Positive for anxiety and  depression. Endo/Heme/Allergies: Negative for environmental allergies and polydipsia. Does not bruise/bleed easily.   Vital Signs: Filed Vitals:   08/03/12 1608 08/03/12 1611  BP:  120/62  Pulse:  81  Temp: 98.4 F (36.9 C)   TempSrc: Oral   Height: 6' 1.5" (1.867 m)   Weight: 87.544 kg (193 lb)   SpO2:  94%    Physical Examination: General:  No acute distress, appears chronically ill Neuro:  Alert, oriented, nonfocal HEENT:  Pale conjunctivae, moist mucus membranes Neck:  No lymphadenopathy Cardiovascular:  Irregular heart, no murmurs Lungs:  Bilateral diminished air entry, bibasilar rales Abdomen:  Soft, nontender, nondistended Musculoskeletal:  No clubbing, cyanosis.  Bilateral LE edema. Skin: No rash   Labs: No results found for this basename: HGB:3,HCT:3,WBC:3,PLT:3,NA:5,K:2,CL:5,CO2:5,GLUCOSE:5,BUN:5,CREATININE:5,CALCIUM:5,MG:5,PHOS:5,INR:5,APTT:5 in the last 168 hours  Chest CT(images reviewed):  02/02/2012 >>> Solid appearing opacity in the anterior basal left lower lobe worrisome for lung neoplasm. Consider follow-up chest x-ray or CT  of the chest and if this area persists then CT may be warranted. 2. 5 mm noncalcified nodule in the left lung  apex. No other lung nodule is seen.  05/04/2012 >>> Left lung base 3.5 x 3.8 cm mass has not cleared and may represent malignancy rather than infection.  CXR (images reviewed):  11/20/2011 >>> New small bilateral pleural effusions noted; associated left basilar airspace opacity may reflect atelectasis or possibly pneumonia.  PFT:  04/15/10  >>> FEV1 2.50 (92%) ratio 68 DLC0 78%  TTE:  NA  ASSESSMENT AND PLAN  Left lower lobe mass, likely primary bronchogenic carcinoma COPD Multiple comorbidities Patient's preference not to undergo major surgical interventions or systemic chemotherapy  -->  PET/CT scan and follow up appointment to discuss results -->  If there is systemic disease, the patient would decline further work  up / systemic therapy -->  If there is limited disease, the patient would consider FNA Bx and SBRT  Orlean Bradford, M.D., F.C.C.P. Interventional Pulmonology [facility] Cell: [phone] Pager: [phone]  08/04/2012, 12:10 AM

## 2012-08-10 ENCOUNTER — Ambulatory Visit
Admission: RE | Admit: 2012-08-10 | Discharge: 2012-08-10 | Disposition: A | Discharge status: Home / Self Care | Payer: Medicare PPO | Source: Ambulatory Visit | Attending: Family Medicine | Admitting: Family Medicine

## 2012-08-10 ENCOUNTER — Encounter (HOSPITAL_COMMUNITY): Payer: Self-pay

## 2012-08-10 ENCOUNTER — Encounter (HOSPITAL_COMMUNITY)
Admission: RE | Admit: 2012-08-10 | Discharge: 2012-08-10 | Disposition: A | Discharge status: Home / Self Care | Payer: Medicare PPO | Source: Ambulatory Visit | Attending: Pulmonary Disease | Admitting: Pulmonary Disease

## 2012-08-10 ENCOUNTER — Telehealth: Payer: Self-pay | Admitting: Pulmonary Disease

## 2012-08-10 ENCOUNTER — Other Ambulatory Visit: Payer: Self-pay | Admitting: Family Medicine

## 2012-08-10 ENCOUNTER — Ambulatory Visit: Payer: Medicare PPO | Admitting: Pulmonary Disease

## 2012-08-10 DIAGNOSIS — M542 Cervicalgia: Secondary | ICD-10-CM

## 2012-08-10 DIAGNOSIS — I700 Atherosclerosis of aorta: Secondary | ICD-10-CM | POA: Insufficient documentation

## 2012-08-10 DIAGNOSIS — I708 Atherosclerosis of other arteries: Secondary | ICD-10-CM | POA: Insufficient documentation

## 2012-08-10 DIAGNOSIS — N400 Enlarged prostate without lower urinary tract symptoms: Secondary | ICD-10-CM | POA: Insufficient documentation

## 2012-08-10 DIAGNOSIS — N289 Disorder of kidney and ureter, unspecified: Secondary | ICD-10-CM | POA: Insufficient documentation

## 2012-08-10 DIAGNOSIS — K5710 Diverticulosis of small intestine without perforation or abscess without bleeding: Secondary | ICD-10-CM | POA: Insufficient documentation

## 2012-08-10 DIAGNOSIS — R222 Localized swelling, mass and lump, trunk: Secondary | ICD-10-CM | POA: Insufficient documentation

## 2012-08-10 DIAGNOSIS — R918 Other nonspecific abnormal finding of lung field: Secondary | ICD-10-CM

## 2012-08-10 DIAGNOSIS — K8689 Other specified diseases of pancreas: Secondary | ICD-10-CM | POA: Insufficient documentation

## 2012-08-10 DIAGNOSIS — K5730 Diverticulosis of large intestine without perforation or abscess without bleeding: Secondary | ICD-10-CM | POA: Insufficient documentation

## 2012-08-10 LAB — GLUCOSE, CAPILLARY: Glucose-Capillary: 170 mg/dL — ABNORMAL HIGH (ref 70–99)

## 2012-08-10 MED ORDER — FLUDEOXYGLUCOSE F - 18 (FDG) INJECTION
16.7000 | Freq: Once | INTRAVENOUS | Status: AC | PRN
  Administered 2012-08-10: 16.7 via INTRAVENOUS

## 2012-08-10 NOTE — Telephone Encounter (Signed)
Hunter Howard spoke with Dr. Herma Carson and he was calling patients at that time to give results. I advised Mr. Sookram that his name is on the list and that Dr. Herma Carson will call him today. Pt states understanding.Carron Curie, CMA

## 2012-08-13 ENCOUNTER — Telehealth: Payer: Self-pay | Admitting: Pulmonary Disease

## 2012-08-13 NOTE — Telephone Encounter (Signed)
Attempted to contact Mr. Frangos with PET/CT results.  Left message to call office or my cell.  Orlean Bradford, M.D., F.C.C.P. Pulmonary and Critical Care Medicine [facility] Cell: [phone] Pager: [phone]

## 2012-08-16 ENCOUNTER — Other Ambulatory Visit: Payer: Self-pay | Admitting: Pulmonary Disease

## 2012-08-16 ENCOUNTER — Telehealth: Payer: Self-pay | Admitting: Cardiovascular Disease

## 2012-08-16 DIAGNOSIS — R918 Other nonspecific abnormal finding of lung field: Secondary | ICD-10-CM

## 2012-08-16 NOTE — Telephone Encounter (Signed)
New problem:  Per Tiffany patient need to hold plavix  5 day prior to CT biopsy .

## 2012-08-16 NOTE — Telephone Encounter (Signed)
Pt needs clearance/okay from Dr. Eden Emms to hold Plavix for 5 days prior to CT guided needle biopsy for a lung mass by Dr. Marin Shutter.  If ok, Tiffany at scheduling says to just put a note in EPIC.

## 2012-08-17 ENCOUNTER — Other Ambulatory Visit: Payer: Self-pay | Admitting: Radiology

## 2012-08-17 ENCOUNTER — Telehealth: Payer: Self-pay | Admitting: *Deleted

## 2012-08-17 ENCOUNTER — Telehealth: Payer: Self-pay | Admitting: Pulmonary Disease

## 2012-08-17 MED ORDER — CLOPIDOGREL BISULFATE 75 MG PO TABS: ORAL_TABLET | ORAL | Status: DC

## 2012-08-17 NOTE — Telephone Encounter (Signed)
Patient had a DES to mid RCA 05/13/12  Should not stop Plavix this soon.  Will ask CM to comment since he did procedure.  ? Who is oncologist or interventional radiologist

## 2012-08-17 NOTE — Telephone Encounter (Signed)
Wife states pt has not been taking his plavix  B/c [employer] did not agree. Pt & wife will understand he should be taking plavix.  Will start with loading dose today of 600mg  as per Dr. Eden Emms Order sent in Mylo Red RN

## 2012-08-17 NOTE — Telephone Encounter (Signed)
Have attempted to reach tiffany at [facility] to discuss pt but the line is busy.  Will try back.

## 2012-08-17 NOTE — Telephone Encounter (Signed)
Agree that Plavix should not be stopped for one year. cdm

## 2012-08-17 NOTE — Telephone Encounter (Signed)
i have called and spoke with tiffany at [facility] and the ct biopsy has been scheduled for pt on Tuesday.  Tiffany spoke with the pt yesterday and the pt stated that he has not been taking the plavix since his insurance company told him that he should not take the plavix along with his other meds.  I have called and spoke with Dr. Eden Emms and they will contact the pt about the importance of taking the plavix.  The pt is to be on the plavix x 1 year. i have called Dr. Herma Carson and made him aware of this information on this pt and he will call and speak with the pt.  Per Dr. Z--ok to sign off of this phone note.

## 2012-08-21 ENCOUNTER — Encounter (HOSPITAL_COMMUNITY): Payer: Self-pay

## 2012-08-21 ENCOUNTER — Ambulatory Visit (HOSPITAL_COMMUNITY)
Admission: RE | Admit: 2012-08-21 | Discharge: 2012-08-21 | Disposition: A | Discharge status: Home / Self Care | Payer: Medicare PPO | Source: Ambulatory Visit | Attending: Interventional Radiology | Admitting: Interventional Radiology

## 2012-08-21 ENCOUNTER — Ambulatory Visit (HOSPITAL_COMMUNITY)
Admission: RE | Admit: 2012-08-21 | Discharge: 2012-08-21 | Disposition: A | Discharge status: Home / Self Care | Payer: Medicare PPO | Source: Ambulatory Visit | Attending: Pulmonary Disease | Admitting: Pulmonary Disease

## 2012-08-21 DIAGNOSIS — R918 Other nonspecific abnormal finding of lung field: Secondary | ICD-10-CM | POA: Insufficient documentation

## 2012-08-21 DIAGNOSIS — C3430 Malignant neoplasm of lower lobe, unspecified bronchus or lung: Secondary | ICD-10-CM | POA: Insufficient documentation

## 2012-08-21 HISTORY — DX: Other nonspecific abnormal finding of lung field: R91.8

## 2012-08-21 HISTORY — PX: LUNG BIOPSY: SHX232

## 2012-08-21 LAB — CBC
HCT: 42.7 % (ref 39.0–52.0)
Hemoglobin: 14.9 g/dL (ref 13.0–17.0)
MCHC: 34.9 g/dL (ref 30.0–36.0)
RDW: 13 % (ref 11.5–15.5)
WBC: 8.6 10*3/uL (ref 4.0–10.5)

## 2012-08-21 LAB — PROTIME-INR
INR: 0.94 (ref 0.00–1.49)
Prothrombin Time: 12.8 seconds (ref 11.6–15.2)

## 2012-08-21 LAB — APTT: aPTT: 30 seconds (ref 24–37)

## 2012-08-21 MED ORDER — MIDAZOLAM HCL 5 MG/5ML IJ SOLN
INTRAMUSCULAR | Status: AC | PRN
  Administered 2012-08-21 (×2): 1 mg via INTRAVENOUS

## 2012-08-21 MED ORDER — FENTANYL CITRATE 0.05 MG/ML IJ SOLN
INTRAMUSCULAR | Status: AC
  Filled 2012-08-21: qty 6

## 2012-08-21 MED ORDER — FENTANYL CITRATE 0.05 MG/ML IJ SOLN
INTRAMUSCULAR | Status: AC | PRN
  Administered 2012-08-21 (×2): 50 ug via INTRAVENOUS

## 2012-08-21 MED ORDER — HYDROCODONE-ACETAMINOPHEN 5-325 MG PO TABS
1.0000 | ORAL_TABLET | ORAL | Status: DC | PRN
  Filled 2012-08-21: qty 2

## 2012-08-21 MED ORDER — SODIUM CHLORIDE 0.9 % IV SOLN
INTRAVENOUS | Status: DC
  Administered 2012-08-21: 10:00:00 via INTRAVENOUS

## 2012-08-21 MED ORDER — MIDAZOLAM HCL 2 MG/2ML IJ SOLN
INTRAMUSCULAR | Status: AC
  Filled 2012-08-21: qty 6

## 2012-08-21 NOTE — Procedures (Signed)
Successful LLL mass 18g core bxs(2) No comp Stable Full report in pacs

## 2012-08-21 NOTE — Telephone Encounter (Signed)
Per Tiffany at the [facility] scheduling department, the pt informed her that he has not taken Plavix for quite a while.  He states that his insurance told him they would not pay for it.  I attempted to contact the pt to verify but the listed phone number is non-working.

## 2012-08-21 NOTE — H&P (Signed)
Chief Complaint: "I'm here for a lung biopsy" Referring Physician:Zubelevitskiy HPI: Hunter Howard is an 78 y.o. male who is found to have a left sided lung mass. He is worked up by Dr. Herma Carson and is now referred for CT guided biopsy. His images were reviewed and determined to be amenable for perc biopsy. Have reviewed recent notes and communications with his cardiology team regarding Plavix use. Despite the notes that say the patient was to take a loading dose of 600mg  followed 75mg  daily, the patient and his family assure me that he has not taken this yet. He states he was given samples from the office several weeks back but finished those and has not taken any of the prescription yet. He otherwise feels fine, with no recent c/o or illnesses.  Past Medical History:  Past Medical History  Diagnosis Date  . Diabetes mellitus   . Hyperlipidemia   . 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 Shortness of breath   . Blood transfusion   . GERD (gastroesophageal reflux disease)   . Coronary artery disease   . CHF (congestive heart failure)   . Dysrhythmia   . Peripheral vascular disease   . Anemia   . Depression   . COPD (chronic obstructive pulmonary disease)     PFT's 04/15/10  FEV1 2.50 (92%) ratio 68 DLC0 78%  . Lung mass     left lower    Past Surgical History:  Past Surgical History  Procedure Date  . Cataract extraction   . Colonoscopy 2012  . Enteroscopy 12/16/2011    Procedure: ENTEROSCOPY;  Surgeon: Theda Belfast, MD;  Location: [facility];  Service: Endoscopy;  Laterality: N/A;  . Coronary angioplasty with stent placement   . Mastectomy     Had when 6months old  . Tonsillectomy     Family History:  Family History  Problem Relation Age of Onset  . Colon cancer Brother     lung cancer - NOT a smoker  . Lung cancer Brother   . Lung cancer Mother   . Hypertension Mother     Social History:  reports that he quit smoking about 30  years ago. His smoking use included Cigarettes. He has a 25 pack-year smoking history. He does not have any smokeless tobacco history on file. He reports that he drinks alcohol. He reports that he does not use illicit drugs.  Allergies: No Known Allergies  Medications: Prior to Admission medications  Medication  Sig  Start Date  End Date  Taking?  Authorizing Provider  acetaminophen-codeine (TYLENOL #3) 300-30 MG per tablet  Take 1 tablet by mouth as needed.  Yes  Historical Provider, MD  aspirin 81 MG chewable tablet  Chew 1 tablet (81 mg total) by mouth daily.  05/08/12  05/08/13  Yes  Laveda Norman, MD  atorvastatin (LIPITOR) 40 MG tablet  Take 20 mg by mouth daily.  Yes  Historical Provider, MD  carvedilol (COREG) 3.125 MG tablet  Take 3.125 mg by mouth 2 (two) times daily with a meal.  10/03/11  10/02/12  Yes  Wendall Stade, MD  cetirizine (ZYRTEC) 10 MG tablet  Take 10 mg by mouth daily.  Yes  Historical Provider, MD  citalopram (CELEXA) 40 MG tablet  Take 40 mg by mouth daily.  11/21/11  Yes  Hollice Espy, MD  clopidogrel (PLAVIX) 75 MG tablet  Take 75 mg by mouth  daily.  Yes  Historical Provider, MD  cyclobenzaprine (FLEXERIL) 10 MG tablet  Take 10 mg by mouth as needed.  Yes  Historical Provider, MD  ferrous sulfate 325 (65 FE) MG tablet  Take 325 mg by mouth daily with breakfast.  Yes  Historical Provider, MD  furosemide (LASIX) 20 MG tablet  Take 20 mg by mouth daily.  Yes  Historical Provider, MD  insulin glargine (LANTUS) 100 UNIT/ML injection  Inject 45 Units into the skin daily.  Yes  Historical Provider, MD  insulin regular (HUMULIN R) 100 UNIT/ML injection  Inject 16 Units into the skin 4 (four) times daily.  Yes  Historical Provider, MD  losartan (COZAAR) 100 MG tablet  Take 50 mg by mouth daily.  Yes  Historical Provider, MD  meclizine (MEDI-MECLIZINE) 25 MG tablet  Take 25 mg by mouth 3 (three) times daily as needed. For  dizziness.  Yes  Historical Provider, MD  metFORMIN (GLUCOPHAGE) 850 MG tablet  Take 850 mg by mouth 3 (three) times daily.  Yes  Historical Provider, MD  nitroGLYCERIN (NITROSTAT) 0.4 MG SL tablet  Place 0.4 mg under the tongue every 5 (five) minutes as needed. For chest pain.  Yes  Historical Provider, MD  omeprazole (PRILOSEC) 40 MG capsule  Take 40 mg by mouth daily.  Yes  Historical Provider, MD  potassium chloride (K-DUR) 10 MEQ tablet  Take 10 mEq by mouth daily.  Yes  Historical Provider, MD  zolpidem (AMBIEN) 10 MG tablet  Take 10 mg by mouth at bedtime as needed.    Please HPI for pertinent positives, otherwise complete 10 system ROS negative.  Physical Exam: Blood pressure 142/73, pulse 91, temperature 96.9 F (36.1 C), temperature source Oral, resp. rate 18, height 6' 1.5" (1.867 m), weight 188 lb (85.276 kg), SpO2 97.00%. Body mass index is 24.47 kg/(m^2).   General Appearance:  Alert, cooperative, no distress, appears stated age  Head:  Normocephalic, without obvious abnormality, atraumatic  ENT: Unremarkable  Neck: Supple, symmetrical, trachea midline, no adenopathy, thyroid: not enlarged, symmetric, no tenderness/mass/nodules  Lungs:   Clear to auscultation bilaterally, no w/r/r, respirations unlabored without use of accessory muscles.  Chest Wall:  No tenderness or deformity  Heart:  Regular rate and rhythm, S1, S2 normal, no murmur, rub or gallop. Carotids 2+ without bruit.  Abdomen:   Soft, non-tender, non distended. Bowel sounds active all four quadrants,  no masses, no organomegaly.  Extremities: Extremities normal, atraumatic, no cyanosis or edema  Neurologic: Normal affect, no gross deficits.   Results for orders placed during the hospital encounter of 08/21/12 (from the past 48 hour(s))  CBC     Status: Normal   Collection Time   08/21/12  9:45 AM      Component Value Range Comment   WBC 8.6  4.0 - 10.5 K/uL    RBC 4.44  4.22 - 5.81 MIL/uL     Hemoglobin 14.9  13.0 - 17.0 g/dL    HCT 16.1  09.6 - 04.5 %    MCV 96.2  78.0 - 100.0 fL    MCH 33.6  26.0 - 34.0 pg    MCHC 34.9  30.0 - 36.0 g/dL    RDW 40.9  81.1 - 91.4 %    Platelets 210  150 - 400 K/uL    No results found.  Assessment/Plan Left lung mass For CT guided biopsy today. Procedure including risks and complications discussed with pt and family in detail. Pt and family confirm  he has not been on Plavix in a few weeks but is instructed to resume it after this procedure. Labs pending Consent signed in chart.  Brayton El PA-C 08/21/2012, 10:20 AM

## 2012-08-23 ENCOUNTER — Other Ambulatory Visit: Payer: Self-pay | Admitting: Pulmonary Disease

## 2012-08-23 DIAGNOSIS — C3490 Malignant neoplasm of unspecified part of unspecified bronchus or lung: Secondary | ICD-10-CM

## 2012-08-23 NOTE — Telephone Encounter (Signed)
PER PT'S WIFE NOT SURE   WHY PT WAS NOT TAKING PLAVIX. SINCE LAST PHONE  CONVERSATION  PT HAD PROCEDURE DONE ON Tuesday  AND HAS RESUMED PLAVIX THIS WEEK .Zack Seal

## 2012-09-03 ENCOUNTER — Ambulatory Visit (INDEPENDENT_AMBULATORY_CARE_PROVIDER_SITE_OTHER): Payer: Medicare PPO | Admitting: Cardiovascular Disease

## 2012-09-03 ENCOUNTER — Telehealth: Payer: Self-pay | Admitting: Pulmonary Disease

## 2012-09-03 ENCOUNTER — Encounter: Payer: Self-pay | Admitting: Cardiovascular Disease

## 2012-09-03 VITALS — BP 118/70 | HR 80 | Ht 73.5 in | Wt 194.0 lb

## 2012-09-03 DIAGNOSIS — I10 Essential (primary) hypertension: Secondary | ICD-10-CM

## 2012-09-03 DIAGNOSIS — E785 Hyperlipidemia, unspecified: Secondary | ICD-10-CM

## 2012-09-03 DIAGNOSIS — R918 Other nonspecific abnormal finding of lung field: Secondary | ICD-10-CM

## 2012-09-03 DIAGNOSIS — I447 Left bundle-branch block, unspecified: Secondary | ICD-10-CM

## 2012-09-03 DIAGNOSIS — I2510 Atherosclerotic heart disease of native coronary artery without angina pectoris: Secondary | ICD-10-CM

## 2012-09-03 DIAGNOSIS — R222 Localized swelling, mass and lump, trunk: Secondary | ICD-10-CM

## 2012-09-03 NOTE — Assessment & Plan Note (Signed)
Well controlled.  Continue current medications and low sodium Dash type diet.    

## 2012-09-03 NOTE — Assessment & Plan Note (Signed)
Apparantly took Pavix for 4 weeks then stopped because pharmacist said it interacts with PPI.  Biopsy done and Plavix resumed.  No angina  If thoracotomy needed in the next 3 months will need cath to document patent stent

## 2012-09-03 NOTE — Assessment & Plan Note (Signed)
Discussed low carb diet.  Target hemoglobin A1c is 6.5 or less.  Continue current medications.  

## 2012-09-03 NOTE — Patient Instructions (Signed)
Your physician recommends that you schedule a follow-up appointment in:  3 MONTHS WITH  DR NISHAN  Your physician recommends that you continue on your current medications as directed. Please refer to the Current Medication list given to you today.  

## 2012-09-03 NOTE — Assessment & Plan Note (Signed)
Stabel no high grade AV block or synocope

## 2012-09-03 NOTE — Assessment & Plan Note (Signed)
Left lung adenocarcinoma  ? XRT is planned Rx with Dr Kathrynn Running.  Needs to F/U with Dr Herma Carson and oncology

## 2012-09-03 NOTE — Telephone Encounter (Signed)
I spoke with pt and advised him he is scheduled to see Dr. Kathrynn Running on 09/06/12 for radiation oncology. He voiced his understanding and needed nothing further

## 2012-09-03 NOTE — Assessment & Plan Note (Signed)
Cholesterol is at goal.  Continue current dose of statin and diet Rx.  No myalgias or side effects.  F/U  LFT's in 6 months. No results found for this basename: LDLCALC             

## 2012-09-03 NOTE — Progress Notes (Signed)
Patient ID: Hunter Howard, male   DOB: 02/25/1927, 78 y.o.   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 Patient does not want w/u of this. Had PCI/stent to RCA. Felt well since D/C with  No dyspnea or chest pain. Not taking Plavix !! Restarted metformin. Compliant with meds otherwise. Reviewed his angio and LV dysfunciton dysprop to CAD. Nice result with stent but concerning that he has not taken plavix since D/C. Confirmed with patient that he does not want w/u for lung CA. No cough sputum or hemoptysis  Just had biopsy of mass while off Plavix.  Path 8/27  Well differentiated adenocarcinoma  DES to RCA placed 5/13    ROS: Denies fever, malais, weight loss, blurry vision, decreased visual acuity, cough, sputum, SOB, hemoptysis, pleuritic pain, palpitaitons, heartburn, abdominal pain, melena, lower extremity edema, claudication, or rash.  All other systems reviewed and negative  General: Affect appropriate Healthy:  appears stated age HEENT: normal Neck supple with no adenopathy JVP normal no bruits no thyromegaly Lungs clear with no wheezing and good diaphragmatic motion Heart:  S1/S2 no murmur, no rub, gallop or click PMI normal Abdomen: benighn, BS positve, no tenderness, no AAA no bruit.  No HSM or HJR Distal pulses intact with no bruits No edema Neuro non-focal Skin warm and dry No muscular weakness   Current Outpatient Prescriptions  Medication Sig Dispense Refill  . acetaminophen-codeine (TYLENOL #3) 300-30 MG per tablet Take 1 tablet by mouth as needed.      Marland Kitchen aspirin 81 MG chewable tablet Chew 1 tablet (81 mg total) by mouth daily.  100 tablet  0  . atorvastatin (LIPITOR) 40 MG tablet Take 20 mg by mouth daily.      . carvedilol (COREG) 3.125 MG tablet Take 3.125 mg by mouth 2 (two) times daily with a meal.      . cetirizine (ZYRTEC) 10 MG tablet Take 10 mg by mouth daily.        . citalopram (CELEXA) 40 MG tablet Take 40  mg by mouth daily.      . clopidogrel (PLAVIX) 75 MG tablet Take 600 mg today. Then starting 08/18/12 take 1 tablet daily  97 tablet  3  . cyclobenzaprine (FLEXERIL) 10 MG tablet Take 10 mg by mouth as needed.      . ferrous sulfate 325 (65 FE) MG tablet Take 325 mg by mouth daily with breakfast.      . furosemide (LASIX) 20 MG tablet Take 20 mg by mouth daily.      . insulin glargine (LANTUS) 100 UNIT/ML injection Inject 45 Units into the skin daily.       . insulin regular (HUMULIN R) 100 UNIT/ML injection Inject 16 Units into the skin 4 (four) times daily.       Marland Kitchen losartan (COZAAR) 100 MG tablet Take 50 mg by mouth daily.      . meclizine (MEDI-MECLIZINE) 25 MG tablet Take 25 mg by mouth 3 (three) times daily as needed. For dizziness.      . metFORMIN (GLUCOPHAGE) 850 MG tablet Take 850 mg by mouth 3 (three) times daily.       . nitroGLYCERIN (NITROSTAT) 0.4 MG SL tablet Place 0.4 mg under the tongue every 5 (five) minutes as needed. For chest pain.      Marland Kitchen omeprazole (PRILOSEC) 40 MG capsule Take 40 mg by mouth daily.        Marland Kitchen  potassium chloride (K-DUR) 10 MEQ tablet Take 10 mEq by mouth daily.      Marland Kitchen zolpidem (AMBIEN) 10 MG tablet Take 10 mg by mouth at bedtime as needed.        Allergies  Review of patient's allergies indicates no known allergies.  Electrocardiogram:  SR LBBB rate 88  05/08/12  Assessment and Plan

## 2012-09-05 ENCOUNTER — Encounter: Payer: Self-pay | Admitting: Radiation Oncology

## 2012-09-05 DIAGNOSIS — J449 Chronic obstructive pulmonary disease, unspecified: Secondary | ICD-10-CM | POA: Insufficient documentation

## 2012-09-05 DIAGNOSIS — I499 Cardiac arrhythmia, unspecified: Secondary | ICD-10-CM | POA: Insufficient documentation

## 2012-09-06 ENCOUNTER — Ambulatory Visit
Admission: RE | Admit: 2012-09-06 | Discharge: 2012-09-06 | Disposition: A | Discharge status: Home / Self Care | Payer: Medicare PPO | Source: Ambulatory Visit | Attending: Radiation Oncology | Admitting: Radiation Oncology

## 2012-09-06 ENCOUNTER — Encounter: Payer: Self-pay | Admitting: Radiation Oncology

## 2012-09-06 VITALS — BP 132/70 | HR 91 | Temp 97.5°F | Resp 18 | Ht 73.0 in | Wt 194.6 lb

## 2012-09-06 DIAGNOSIS — E785 Hyperlipidemia, unspecified: Secondary | ICD-10-CM | POA: Insufficient documentation

## 2012-09-06 DIAGNOSIS — Z7982 Long term (current) use of aspirin: Secondary | ICD-10-CM | POA: Insufficient documentation

## 2012-09-06 DIAGNOSIS — I10 Essential (primary) hypertension: Secondary | ICD-10-CM | POA: Insufficient documentation

## 2012-09-06 DIAGNOSIS — Z801 Family history of malignant neoplasm of trachea, bronchus and lung: Secondary | ICD-10-CM | POA: Insufficient documentation

## 2012-09-06 DIAGNOSIS — C3430 Malignant neoplasm of lower lobe, unspecified bronchus or lung: Secondary | ICD-10-CM

## 2012-09-06 DIAGNOSIS — Z8042 Family history of malignant neoplasm of prostate: Secondary | ICD-10-CM | POA: Insufficient documentation

## 2012-09-06 DIAGNOSIS — K219 Gastro-esophageal reflux disease without esophagitis: Secondary | ICD-10-CM | POA: Insufficient documentation

## 2012-09-06 DIAGNOSIS — Z87891 Personal history of nicotine dependence: Secondary | ICD-10-CM | POA: Insufficient documentation

## 2012-09-06 DIAGNOSIS — I739 Peripheral vascular disease, unspecified: Secondary | ICD-10-CM | POA: Insufficient documentation

## 2012-09-06 DIAGNOSIS — J449 Chronic obstructive pulmonary disease, unspecified: Secondary | ICD-10-CM | POA: Insufficient documentation

## 2012-09-06 DIAGNOSIS — E119 Type 2 diabetes mellitus without complications: Secondary | ICD-10-CM | POA: Insufficient documentation

## 2012-09-06 DIAGNOSIS — Z79899 Other long term (current) drug therapy: Secondary | ICD-10-CM | POA: Insufficient documentation

## 2012-09-06 DIAGNOSIS — J4489 Other specified chronic obstructive pulmonary disease: Secondary | ICD-10-CM | POA: Insufficient documentation

## 2012-09-06 DIAGNOSIS — I2510 Atherosclerotic heart disease of native coronary artery without angina pectoris: Secondary | ICD-10-CM | POA: Insufficient documentation

## 2012-09-06 DIAGNOSIS — Z510 Encounter for antineoplastic radiation therapy: Secondary | ICD-10-CM | POA: Insufficient documentation

## 2012-09-06 DIAGNOSIS — Z800 Family history of malignant neoplasm of digestive organs: Secondary | ICD-10-CM | POA: Insufficient documentation

## 2012-09-06 DIAGNOSIS — R918 Other nonspecific abnormal finding of lung field: Secondary | ICD-10-CM

## 2012-09-06 NOTE — Assessment & Plan Note (Signed)
Currently planning stereotactic body radiotherapy

## 2012-09-06 NOTE — Progress Notes (Signed)
See progress note under physician encounter. 

## 2012-09-06 NOTE — Progress Notes (Signed)
Complete PATIENT MEASURE OF DISTRESS worksheet with a score of 2 submitted to social work.  

## 2012-09-06 NOTE — Progress Notes (Signed)
78 year old male. Former smoker. married  Adamantly against surgical interventions. Interested in SBRT if indicated. 08/21/12 lung biopsy revealed well differentiated adenocarcinoma.  Patient presents to the clinic today accompanied by his wife and daughter for a consult with Dr. Kathrynn Running to discuss the role of radiation therapy in the treatment of lung ca. Patient is alert and oriented to person, place, and time. No distress noted. Slow steady gait noted with assistance of cane. Pleasant affect noted. Patient denies pain at this time. Patient reports stable weight. Patient denies difficulty swallowing. Patient reports sleeping without difficulty with the aid of ambien. Patient reports only an occasional cough with tan sputum. Patient denies ever seeing blood in sputum. Patient reports shortness of breath mostly with exertion but, occasionally at rest. Patient reports that his energy level is about 75%. Patient denies nausea or vomiting. Patient reports occasional dizziness with rising. Patient reports an occasional brief mild headache. Patient reports that right eye floaters continue. Patient reports arthritis in his neck. Patient reports an excellent appetite. Reported all findings to Dr. Kathrynn Running.    NKDA No hx of radiation therapy Denies having a pacemaker

## 2012-09-06 NOTE — Progress Notes (Signed)
Radiation Oncology         [phone]) [phone] ________________________________  Initial outpatient Consultation  Name: Hunter Howard MRN: [id number]  Date: 09/06/2012  DOB: 04/25/1927  WJ:XBJYNWG,NFAOZH TOM, MD  Rodena Medin*   REFERRING PHYSICIAN: Rodena Medin*  DIAGNOSIS: 78 year old gentleman with a 4.1 cm well differentiated adenocarcinoma of the left lower lobe of the lung (T2a N0 M0 - Stage IB)  HISTORY OF PRESENT ILLNESS::Hunter Howard is a 78 y.o. male who was noted to have an opacity in the left lower lung on chest x-rays and CT imaging dating back to February 2013. This was carefully followed with trials of antibiotics without clearance of the airspace disease in left lower lung. PET CT was performed demonstrating hypermetabolism and CT-guided needle biopsy revealed well-differentiated adenocarcinoma in the 4.1 cm tumor in the left lower lobe lung adjacent to the chest wall and diaphragm. The patient is not felt to be an ideal surgical candidate because his past medical history and is commonly referred today for discussion of potential radiation treatment options.Marland Kitchen  PREVIOUS RADIATION THERAPY: No  PAST MEDICAL HISTORY:  has a past medical history of Diabetes mellitus; Hyperlipidemia; Hypertension; Shortness of breath; Blood transfusion; GERD (gastroesophageal reflux disease); Coronary artery disease; CHF (congestive heart failure); Dysrhythmia; Peripheral vascular disease; Anemia; Depression; COPD (chronic obstructive pulmonary disease); and Lung mass (08/21/12).    PAST SURGICAL HISTORY: Past Surgical History  Procedure Date  . Cataract extraction   . Colonoscopy 2012  . Enteroscopy 12/16/2011    Procedure: ENTEROSCOPY;  Surgeon: Theda Belfast, MD;  Location: [facility];  Service: Endoscopy;  Laterality: N/A;  . Coronary angioplasty with stent placement   . Mastectomy     Had when 6months old  . Tonsillectomy   . Lung biopsy 08/21/12    LLL    FAMILY HISTORY:  family history includes Colon cancer in his brother; Hypertension in his mother; Lung cancer in his mother; and Prostate cancer in his brother.  SOCIAL HISTORY:  reports that he quit smoking about 30 years ago. His smoking use included Cigarettes. He has a 25 pack-year smoking history. He has never used smokeless tobacco. He reports that he does not drink alcohol or use illicit drugs.  ALLERGIES: Review of patient's allergies indicates no known allergies.  MEDICATIONS:  Current Outpatient Prescriptions  Medication Sig Dispense Refill  . acetaminophen-codeine (TYLENOL #3) 300-30 MG per tablet Take 1 tablet by mouth as needed.      Marland Kitchen aspirin 81 MG chewable tablet Chew 1 tablet (81 mg total) by mouth daily.  100 tablet  0  . atorvastatin (LIPITOR) 40 MG tablet Take 20 mg by mouth daily.      . carvedilol (COREG) 3.125 MG tablet Take 3.125 mg by mouth 2 (two) times daily with a meal.      . cetirizine (ZYRTEC) 10 MG tablet Take 10 mg by mouth daily.        . citalopram (CELEXA) 40 MG tablet Take 40 mg by mouth daily.      . clopidogrel (PLAVIX) 75 MG tablet Take 600 mg today. Then starting 08/18/12 take 1 tablet daily  97 tablet  3  . cyclobenzaprine (FLEXERIL) 10 MG tablet Take 10 mg by mouth as needed.      . ferrous sulfate 325 (65 FE) MG tablet Take 325 mg by mouth daily with breakfast.      . furosemide (LASIX) 20 MG tablet Take 20 mg by mouth daily.      . insulin glargine (  LANTUS) 100 UNIT/ML injection Inject 50 Units into the skin daily.       . insulin regular (HUMULIN R) 100 UNIT/ML injection Inject 16 Units into the skin 4 (four) times daily.       Marland Kitchen losartan (COZAAR) 100 MG tablet Take 50 mg by mouth daily.      . meclizine (MEDI-MECLIZINE) 25 MG tablet Take 25 mg by mouth 3 (three) times daily as needed. For dizziness.      . metFORMIN (GLUCOPHAGE) 850 MG tablet Take 850 mg by mouth 2 (two) times daily.       . nitroGLYCERIN (NITROSTAT) 0.4 MG SL tablet Place 0.4 mg under the tongue  every 5 (five) minutes as needed. For chest pain.      Marland Kitchen omeprazole (PRILOSEC) 40 MG capsule Take 40 mg by mouth daily.        . potassium chloride (K-DUR) 10 MEQ tablet Take 10 mEq by mouth daily.      Marland Kitchen zolpidem (AMBIEN) 10 MG tablet Take 10 mg by mouth at bedtime as needed.        REVIEW OF SYSTEMS:  A 15 point review of systems is documented in the electronic medical record. This was obtained by the nursing staff. However, I reviewed this with the patient to discuss relevant findings and make appropriate changes.  A comprehensive review of systems was negative. the patient has no chest pain. He has no dyspnea. No hemoptysis. He has had no recent weight loss.   PHYSICAL EXAM:  height is 6\' 1"  (1.854 m) and weight is 194 lb 9.6 oz (88.27 kg). His oral temperature is 97.5 F (36.4 C). His blood pressure is 132/70 and his pulse is 91. His respiration is 18 and oxygen saturation is 94%.   Patient is in no acute distress today. Is alert and oriented. He appears somewhat younger than his stated age. Examination of the chest wall at the treatment site reveals the biopsy site. There is no chest wall tenderness or wound at the biopsy site per se. Respirations unremarkable.  LABORATORY DATA:  Lab Results  Component Value Date   WBC 8.6 08/21/2012   HGB 14.9 08/21/2012   HCT 42.7 08/21/2012   MCV 96.2 08/21/2012   PLT 210 08/21/2012   Lab Results  Component Value Date   NA 137 05/08/2012   K 3.9 05/08/2012   CL 97 05/08/2012   CO2 28 05/08/2012   Lab Results  Component Value Date   ALT 12 05/04/2012   AST 22 05/04/2012   ALKPHOS 65 05/04/2012   BILITOT 0.7 05/04/2012     RADIOGRAPHY: Dg Chest 1 View  08/21/2012  *RADIOLOGY REPORT*  Clinical Data: Left lower lobe mass biopsy.  CHEST - 1 VIEW  Comparison: 05/03/2012  Findings: There is a small amount of aerated lung lateral to the mass in the left lower lobe.  No pneumothorax observed. Cardiomegaly is present.  Thoracic spondylosis noted.  IMPRESSION:  1.  Indistinct mass, left lower lobe.  Lucency lateral to the mass is attributed to aerated lung rather than pneumothorax based on the appearance on biopsy CT.  2.  Cardiomegaly.  3.  Thoracic spondylosis.   Original Report Authenticated By: Dellia Cloud, M.D.    Dg Cervical Spine Complete  08/10/2012  *RADIOLOGY REPORT*  Clinical Data: Left-sided neck pain without trauma.  CERVICAL SPINE - COMPLETE 4+ VIEW  Comparison: None.  Findings: The lateral view images through bottom of C6. Prevertebral soft tissues are within normal  limits.  Maintenance of vertebral body heights.  Marked anterior osteophyte formation at C4- C6.  Right greater than left bilateral neural foraminal narrowing.  C3- C4, C4-C5, and C5-C6 on the right.  C5-C6 in the left.  Lateral masses symmetric.  Odontoid process partially obscured, without displaced fracture.  C7-T1 are maintained in height.  Upper thoracic spondylosis is suboptimally evaluated.  IMPRESSION: Lower cervical spondylosis, without acute finding.  Original Report Authenticated By: Consuello Bossier, M.D.   Nm Pet Image Initial (pi) Skull Base To Thigh  08/10/2012  *RADIOLOGY REPORT*  Clinical Data: Initial treatment strategy for lung cancer.  NUCLEAR MEDICINE PET SKULL BASE TO THIGH  Fasting Blood Glucose:  12/1968  Technique:  16.7 mCi F-18 FDG was injected intravenously. CT data was obtained and used for attenuation correction and anatomic localization only.  (This was not acquired as a diagnostic CT examination.) Additional exam technical data entered on technologist worksheet.  Comparison:  Multiple exams, including 05/04/2012  Findings:  Neck: No hypermetabolic lymph nodes in the neck.  Chest:  Maximum standard uptake value in the left lower lobe mass/airspace opacity is 4.6. This process measures 3.1 x 4.1 cm (formerly approximately 3.6 x 3.5 cm).No pathologic or hypermetabolic thoracic adenopathy observed.  Coronary artery atherosclerosis is present.   Abdomen/Pelvis:  Atrophic pancreas noted.  Duodenal diverticulum noted.  Physiologic activity is present in the bowel.  Aortoiliac atherosclerotic calcification is present.  No significant hypermetabolic activity noted in the abdomen or pelvis to suggest metastatic disease.  Hypodense renal lesions favor cysts on the right.  Sigmoid diverticulosis noted.  Enlarged prostate gland is present.  No significant hypermetabolic activity noted in the vicinity of the prior peripheral right hepatic lobe enhancement shown on 03/13/2012.  [place]:  No focal hypermetabolic activity to suggest skeletal metastasis. Bilateral pars defects at L5 and with grade 1 anterolisthesis.  IMPRESSION: 1.  Airspace opacity/mass in the left lower lobe has a maximum standard uptake of value of 4.6, compatible with malignancy or active granulomatous process. 2.  Atherosclerosis. 3.  Atrophic pancreas. 4.  Suspected right renal cyst. 5.  Sigmoid diverticulosis. 6.  Bilateral pars defects at L5 with grade 1 anterolisthesis.  Original Report Authenticated By: Dellia Cloud, M.D.   Ct Biopsy  08/21/2012  *RADIOLOGY REPORT*  Clinical Data: Left lower lobe persistent mass / consolidation which is PET positive, concerning for malignancy  CT GUIDED LEFT LOWER LOBE MASS 18 GAUGE CORE BIOPSY  Date:  08/21/2012 11:00:00  Radiologist:  M. Ruel Favors, M.D.  Medications:  2 mg Versed, 100 mcg Fentanyl  Guidance:  CT  Fluoroscopy time:  None.  Sedation time:  15 minutes  Contrast volume:  None.  Complications:  No immediate  PROCEDURE/FINDINGS:  Informed consent was obtained from the patient following explanation of the procedure, risks, benefits and alternatives. The patient understands, agrees and consents for the procedure. All questions were addressed.  A time out was performed.  Maximal barrier sterile technique utilized including caps, mask, sterile gowns, sterile gloves, large sterile drape, hand hygiene, and betadine  Previous imaging  reviewed. The patient was positioned left side down decubitus.  Noncontrast localization CT performed.  The inferior left lower lobe mass was localized.  Under sterile conditions and local anesthesia, a 17 gauge 6.8 cm access needle was advanced from posterior intercostal approach into the lesion. Needle position confirmed with CT.  2  18 gauge core biopsies obtained and placed in formalin.  Samples were intact non fragmented.  Needle removed.  Post procedure imaging demonstrates no evidence of hemorrhage or hematoma.  The patient tolerated the biopsy well.  IMPRESSION: Successful CT guided left lower lobe mass 18 gauge core biopsies   Original Report Authenticated By: Judie Petit. Ruel Favors, M.D.       IMPRESSION: This patient is a very nice 78 year old gentleman with a 4.1 cm well-differentiated adenocarcinoma left lower lobe lung. He has no evidence of metastatic disease and regional lymph nodes or distant sites on PET imaging. The patient is not ideal surgical candidate because of his advanced age and medical comorbidities. He would potentially benefit from localized radiotherapy to the tumor using either a standard conventional radiation techniques or possibly stereotactic body radiotherapy. He appears to be a candidate for either approach and there may be some advantages to stereotactic body radiotherapy in terms of the abbreviated treatment schedule and increased local control associated with ablative doses. His tumor location adjacent to the rib cage and adjacent to the diaphragm above the spleen may place him at somewhat increased risk for chest wall pain, diaphragm injury, and focal injury to the spleen. However, I suspect these limited risks may be acceptable in order to achieve maximum likelihood for tumor control.  PLAN:   Today, I talked patient of the findings and workup thus far. We Talked the management of non-small cell lung cancer. We Talked about surgical options versus radiation options. We  talked about radiation techniques including conventionally fractionated radiation versus stereotactic body radiotherapy. We discussed the pros and cons of each of these approaches. We Talked about the risks associated with stereotactic body radiotherapy.  I spent 60 minutes minutes face to face with the patient and more than 50% of that time was spent in counseling and/or coordination of care.I filled out a patient counseling form for him with relevant treatment diagrams retained a copy for our records. The patient would like to proceed with simulation on September 20 your to deliver stereotactic body radiotherapy to the tumor as discussed.   At the patient's request, like referral to Dr. Cyndie Chime to discuss overall care and followup as we move forward in the management of his non-small cell lung cancer.   ------------------------------------------------  Artist Pais Kathrynn Running, M.D.

## 2012-09-11 ENCOUNTER — Telehealth: Payer: Self-pay | Admitting: Oncology

## 2012-09-11 NOTE — Telephone Encounter (Signed)
S/W pt dtr in re NP appt 10/9 @ 3 w/ Dr. Cyndie Chime NP packet mailed.

## 2012-09-12 ENCOUNTER — Telehealth: Payer: Self-pay | Admitting: Oncology

## 2012-09-12 NOTE — Telephone Encounter (Signed)
C/D on 9/18 for appt 10/09

## 2012-09-13 ENCOUNTER — Other Ambulatory Visit: Payer: Self-pay | Admitting: Oncology

## 2012-09-13 DIAGNOSIS — C3430 Malignant neoplasm of lower lobe, unspecified bronchus or lung: Secondary | ICD-10-CM

## 2012-09-14 ENCOUNTER — Ambulatory Visit
Admission: RE | Admit: 2012-09-14 | Discharge: 2012-09-14 | Disposition: A | Discharge status: Home / Self Care | Payer: Medicare PPO | Source: Ambulatory Visit | Attending: Radiation Oncology | Admitting: Radiation Oncology

## 2012-09-14 ENCOUNTER — Encounter: Payer: Self-pay | Admitting: Radiation Oncology

## 2012-09-14 DIAGNOSIS — C3430 Malignant neoplasm of lower lobe, unspecified bronchus or lung: Secondary | ICD-10-CM

## 2012-09-14 NOTE — Assessment & Plan Note (Signed)
Planning stereotactic body radiotherapy with simulation today

## 2012-09-14 NOTE — Progress Notes (Signed)
  Radiation Oncology         [phone]) [phone] ________________________________  Name: Hunter Howard MRN: [id number]  Date: 09/14/2012  DOB: 1927/07/09  SIMULATION AND TREATMENT PLANNING NOTE  DIAGNOSIS:  4.1 cm well differentiated adenocarcinoma of the left lower lobe of the lung (T2a N0 M0 - Stage IB) Planning stereotactic body radiotherapy with simulation today   NARRATIVE:  The patient was brought to the CT Simulation planning suite.  Identity was confirmed.  All relevant records and images related to the planned course of therapy were reviewed.  The patient freely provided informed written consent to proceed with treatment after reviewing the details related to the planned course of therapy. The consent form was witnessed and verified by the simulation staff.  Then, the patient was set-up in a stable reproducible  supine position for radiation therapy.  CT images were obtained.  Surface markings were placed.  The CT images were loaded into the planning software.  Then the target and avoidance structures were contoured.  Treatment planning then occurred.  The radiation prescription was entered and confirmed.  A total of 2 complex treatment devices were fabricated. I have requested : 3D Simulation  I have requested a DVH of the following structures: lungs, heart, spleen, stomach, esophagus, chest wall and tumor.    PLAN:  The patient will receive 50 Gy in 5 fraction.  ________________________________  Artist Pais Kathrynn Running, M.D.

## 2012-09-14 NOTE — Progress Notes (Signed)
Met with patient to discuss RO billing.  Dx: 162.5 Lower lobe, lung  Attending Rad: Dr. Landis Gandy Tx: Robot SRS x5

## 2012-09-21 ENCOUNTER — Other Ambulatory Visit (HOSPITAL_COMMUNITY)
Admission: RE | Admit: 2012-09-21 | Discharge: 2012-09-21 | Disposition: A | Discharge status: Home / Self Care | Payer: Medicare PPO | Source: Ambulatory Visit | Attending: Internal Medicine | Admitting: Internal Medicine

## 2012-09-21 DIAGNOSIS — C3490 Malignant neoplasm of unspecified part of unspecified bronchus or lung: Secondary | ICD-10-CM | POA: Insufficient documentation

## 2012-09-24 NOTE — Addendum Note (Signed)
Encounter addended by: Envy Meno Mintz Roddrick Sharron, RN on: 09/24/2012  7:52 PM<BR>     Documentation filed: Charges VN

## 2012-09-25 ENCOUNTER — Ambulatory Visit
Admission: RE | Admit: 2012-09-25 | Discharge: 2012-09-25 | Disposition: A | Discharge status: Home / Self Care | Payer: Medicare PPO | Source: Ambulatory Visit | Attending: Radiation Oncology | Admitting: Radiation Oncology

## 2012-09-25 DIAGNOSIS — C3430 Malignant neoplasm of lower lobe, unspecified bronchus or lung: Secondary | ICD-10-CM

## 2012-09-25 NOTE — Progress Notes (Signed)
  Radiation Oncology         [phone] ________________________________  Name: Eaton Folmar MRN: [id number]  Date: 09/25/2012  DOB: 1927-07-08  Stereotactic Body Radiotherapy Treatment Procedure Note  NARRATIVE:  Arther Heisler was brought to the stereotactic radiation treatment machine and placed supine on the CT couch. The patient was set up for stereotactic body radiotherapy on the body fix pillow.  3D TREATMENT PLANNING AND DOSIMETRY:  The patient's radiation plan was reviewed and approved prior to starting treatment.  It showed 3-dimensional radiation distributions overlaid onto the planning CT.  The [facility] for the target structures as well as the organs at risk were reviewed. The documentation of this is filed in the radiation oncology EMR.  SIMULATION VERIFICATION:  The patient underwent CT imaging on the treatment unit.  These were carefully aligned to document that the ablative radiation dose would cover the target volume and maximally spare the nearby organs at risk according to the planned distribution.  SPECIAL TREATMENT PROCEDURE: Pamelia Hoit received high dose ablative stereotactic body radiotherapy to the planned target volume without unforeseen complications. Treatment was delivered uneventfully. The high doses associated with stereotactic body radiotherapy and the significant potential risks require careful treatment set up and patient monitoring constituting a special treatment procedure   STEREOTACTIC TREATMENT MANAGEMENT:  Following delivery, the patient was evaluated clinically. The patient tolerated treatment without significant acute effects, and was discharged to home in stable condition.    PLAN: Continue treatment as planned.  ________________________________  Billie Lade, PhD, MD

## 2012-09-26 ENCOUNTER — Ambulatory Visit: Payer: Medicare PPO

## 2012-09-27 ENCOUNTER — Ambulatory Visit
Admission: RE | Admit: 2012-09-27 | Discharge: 2012-09-27 | Disposition: A | Discharge status: Home / Self Care | Payer: Medicare PPO | Source: Ambulatory Visit | Attending: Radiation Oncology | Admitting: Radiation Oncology

## 2012-09-27 ENCOUNTER — Encounter: Payer: Self-pay | Admitting: Radiation Oncology

## 2012-10-01 ENCOUNTER — Ambulatory Visit
Admission: RE | Admit: 2012-10-01 | Discharge: 2012-10-01 | Disposition: A | Discharge status: Home / Self Care | Payer: Medicare PPO | Source: Ambulatory Visit | Attending: Radiation Oncology | Admitting: Radiation Oncology

## 2012-10-01 ENCOUNTER — Encounter: Payer: Self-pay | Admitting: Radiation Oncology

## 2012-10-03 ENCOUNTER — Other Ambulatory Visit (HOSPITAL_BASED_OUTPATIENT_CLINIC_OR_DEPARTMENT_OTHER): Payer: Medicare PPO

## 2012-10-03 ENCOUNTER — Ambulatory Visit (HOSPITAL_BASED_OUTPATIENT_CLINIC_OR_DEPARTMENT_OTHER): Payer: Medicare PPO | Admitting: Oncology

## 2012-10-03 ENCOUNTER — Ambulatory Visit (HOSPITAL_BASED_OUTPATIENT_CLINIC_OR_DEPARTMENT_OTHER): Payer: Medicare PPO

## 2012-10-03 ENCOUNTER — Ambulatory Visit: Admission: RE | Admit: 2012-10-03 | Payer: Medicare PPO | Source: Ambulatory Visit

## 2012-10-03 VITALS — BP 122/75 | HR 85 | Temp 96.9°F | Resp 20 | Ht 73.0 in | Wt 193.6 lb

## 2012-10-03 DIAGNOSIS — Z87891 Personal history of nicotine dependence: Secondary | ICD-10-CM

## 2012-10-03 DIAGNOSIS — C3430 Malignant neoplasm of lower lobe, unspecified bronchus or lung: Secondary | ICD-10-CM

## 2012-10-03 DIAGNOSIS — I509 Heart failure, unspecified: Secondary | ICD-10-CM

## 2012-10-03 DIAGNOSIS — J988 Other specified respiratory disorders: Secondary | ICD-10-CM

## 2012-10-03 LAB — COMPREHENSIVE METABOLIC PANEL (CC13)
ALT: 13 U/L (ref 0–55)
AST: 13 U/L (ref 5–34)
Albumin: 3.7 g/dL (ref 3.5–5.0)
CO2: 23 mEq/L (ref 22–29)
Calcium: 9.5 mg/dL (ref 8.4–10.4)
Chloride: 105 mEq/L (ref 98–107)
Potassium: 4.1 mEq/L (ref 3.5–5.1)
Sodium: 138 mEq/L (ref 136–145)
Total Protein: 7.4 g/dL (ref 6.4–8.3)

## 2012-10-03 LAB — CBC WITH DIFFERENTIAL/PLATELET
BASO%: 0.4 % (ref 0.0–2.0)
EOS%: 6.1 % (ref 0.0–7.0)
HCT: 44 % (ref 38.4–49.9)
MCH: 34.2 pg — ABNORMAL HIGH (ref 27.2–33.4)
MCHC: 34.2 g/dL (ref 32.0–36.0)
MONO#: 0.9 10*3/uL (ref 0.1–0.9)
RDW: 13.3 % (ref 11.0–14.6)
WBC: 5.9 10*3/uL (ref 4.0–10.3)
lymph#: 1 10*3/uL (ref 0.9–3.3)

## 2012-10-03 LAB — LACTATE DEHYDROGENASE (CC13): LDH: 164 U/L (ref 125–220)

## 2012-10-03 NOTE — Progress Notes (Signed)
New Patient Hematology-Oncology Evaluation   Hunter Howard [id number] [birth year] 78 y.o. 10/03/2012  CC: Dr. Margaretmary Dys; Dr. Jeani Hawking; Dr. Charlton Haws   Reason for referral: Established medical oncology followup for this man with recently diagnosed non-small cell lung cancer   HPI:  Pleasant 78 year old man with coronary artery disease, chronic congestive heart failure, hypertension, type 2 diabetes, insulin-dependent for the last 5 years, status post coronary stent placement. His wife is my patient. He accompanied her for a visit back in May. He had been recently told that a lesion was seen in his left lung on a chest x-ray. He told me that if it was cancer he didn't want any treatment. I told him that if it was a localized lesion it was potentially curable with surgery or radiation and encouraged him to have further workup with a biopsy and PET scan. He decided to go ahead and do this. He had a PET scan on August 16. This showed uptake limited to a 3 x 4 cm lesion in the left lower lung. No uptake in the hilum or mediastinum or outside of the chest cavity. Transcutaneous biopsy on August 27 showed a well-differentiated adenocarcinoma. Tumor tested negative for EGFR over expression and negative for the ALK gene mutation.  At time of his initial CT scan in May, he had a myocardial nuclear medicine imaging study which showed an ejection fraction of only 18% with severe global hypokinesis and a nearly akinetic ventricular septum. He is a former 30 pack year smoker who stopped smoking 30 years ago. He does have obstructive airway disease but I cannot find results of pulmonary function tests. In any case, he was not a surgical candidate due to his advanced cardiopulmonary disease. He was referred for radiation therapy and was evaluated by Dr. Margaretmary Dys. He is currently completing a 5 fraction course of high-dose radiation with curative intent using a stereotactic body technique. Plan is to  deliver 50 gray in 5 fractions. He'll receive his fifth and final fraction this Friday. So far he is tolerating the treatment well. No dysphagia or esophagitis.    PMH: Past Medical History  Diagnosis Date  . Diabetes mellitus   . Hyperlipidemia   . 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 Shortness of breath   . Blood transfusion   . GERD (gastroesophageal reflux disease)   . Coronary artery disease   . CHF (congestive heart failure)   . Dysrhythmia   . Peripheral vascular disease   . Anemia   . Depression   . COPD (chronic obstructive pulmonary disease)     PFT's 04/15/10  FEV1 2.50 (92%) ratio 68 DLC0 78%  . Lung mass 08/21/12    left lower, adenocarcinoma  Benign prostatic hyperplasia with negative biopsies for cancer in the past. No history of hepatitis, yellow jaundice, malaria, tuberculosis. No asbestos exposure. No history of seizure or stroke. No blood clots. No thyroid disease. No ulcers.  Past Surgical History  Procedure Date  . Cataract extraction   . Colonoscopy 2012  . Enteroscopy 12/16/2011    Procedure: ENTEROSCOPY;  Surgeon: Theda Belfast, MD;  Location: [facility];  Service: Endoscopy;  Laterality: N/A;  . Coronary angioplasty with stent placement   . Mastoidectomy     Had when 6months old  . Tonsillectomy   . Lung biopsy 08/21/12    LLL    Allergies: No Known Allergies  Medications:aspirin 81  mg daily, Lipitor 40 mg daily, Coreg 3.125 mg twice a day, Zyrtec 10 mg daily, Celexa 40 mg daily, Plavix 75 mg daily, Flexeril 10 mg Q8 hours when necessary, iron 325 mg daily, Lantus insulin 50 units 6 subcutaneous daily, regular insulin 16 units 4 times daily, Cozaar 100 mg one half tablet daily, meclizine 25 mg 3 times a day when necessary vertigo, Glucophage 850 mg twice a day, Nitrostat 0.4 mg sublingual when necessary chest pain, Prilosec 40 mg daily, potassium 2 mEq daily, Ambien 10 mg at bedtime when necessary  sleep.   Social History: married. Wife and daughter accompany him today. 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 No asbestos exposure.  reports that he quit smoking about 30 years ago. His smoking use included Cigarettes. He has a 25 pack-year smoking history. He has never used smokeless tobacco. He reports that he does not drink alcohol or use illicit drugs.  Family History: Family History  Problem Relation Age of Onset  . Colon cancer Brother     lung cancer - NOT a smoker  . Prostate cancer Brother   . Lung cancer Mother   . Hypertension Mother     Review of Systems: Constitutional symptoms: no anorexia, no weight loss  HEENT: no sore throat  Respiratory:  Dyspnea with exertion Cardiovascular:   currently no chest pain or palpitations  Gastrointestinal ROS: no change in bowel habit  Genito-Urinary ROS:  nocturia  Hematological and Lymphatic: Musculoskeletal: no muscle or bone pain  Neurologic: no headache or change in vision  Dermatologic: no rash  Remaining ROS negative.  Physical Exam: Blood pressure 122/75, pulse 85, temperature 96.9 F (36.1 C), temperature source Oral, resp. rate 20, height 6\' 1"  (1.854 m), weight 193 lb 9.6 oz (87.816 kg). Wt Readings from Last 3 Encounters:  10/03/12 193 lb 9.6 oz (87.816 kg)  09/06/12 194 lb 9.6 oz (88.27 kg)  09/03/12 194 lb (87.998 kg)    General appearance: tall, pale, well-nourished Caucasian man  Head:  left periorbital ecchymosis  Neck:  full range of motion  Lymph nodes: No adenopathy Breasts: Lungs: clear to auscultation resonant to percussion  Heart:Regular rhythm, no murmur, no gallop  Abdominal: soft, nontender, no mass, no organomegaly GU: not examined  Extremities: no edema, no calf tenderness  Neurologic: alert and oriented, PERRLA, motor strength 5 over 5, reflexes 1+ symmetric, sensation  mildly decreased to vibration by tuning fork exam over the fingertips  Skin: Pale no rash or ecchymosis    Lab Results: Lab Results  Component Value Date   WBC 5.9 10/03/2012   HGB 15.0 10/03/2012   HCT 44.0 10/03/2012   MCV 100.0* 10/03/2012   PLT 206 10/03/2012     Chemistry      Component Value Date/Time   NA 138 10/03/2012 1538   NA 137 05/08/2012 0450   K 4.1 10/03/2012 1538   K 3.9 05/08/2012 0450   CL 105 10/03/2012 1538   CL 97 05/08/2012 0450   CO2 23 10/03/2012 1538   CO2 28 05/08/2012 0450   BUN 14.0 10/03/2012 1538   BUN 16 05/08/2012 0450   CREATININE 0.9 10/03/2012 1538   CREATININE 0.93 05/08/2012 0450      Component Value Date/Time   CALCIUM 9.5 10/03/2012 1538   CALCIUM 9.8 05/08/2012 0450   ALKPHOS 79 10/03/2012 1538  ALKPHOS 65 05/04/2012 0455   AST 13 10/03/2012 1538   AST 22 05/04/2012 0455   ALT 13 10/03/2012 1538   ALT 12 05/04/2012 0455   BILITOT 0.80 10/03/2012 1538   BILITOT 0.7 05/04/2012 0455       Pathology: well-differentiated adenocarcinoma negative no overexpression EGFR    Radiological Studies: see discussion above    Impression and Plan: Stage IB well-differentiated adenocarcinoma left lung T2 A. N0 M0 He is being treated with primary radiation therapy. No indication for chemotherapy.  I will get a CT scan in 2 months in early December and then at 4 month intervals for the first year, 6 months intervals for the second year and then annual chest x-rays        Levert Feinstein, MD 10/03/2012, 4:42 PM

## 2012-10-04 ENCOUNTER — Telehealth: Payer: Self-pay | Admitting: Oncology

## 2012-10-04 NOTE — Telephone Encounter (Signed)
Talked to patient and gave him appt for December 2013 lab and Ct, NPO 4 hours prior to scan then see MD after a few days

## 2012-10-05 ENCOUNTER — Encounter: Payer: Self-pay | Admitting: Radiation Oncology

## 2012-10-05 ENCOUNTER — Ambulatory Visit
Admission: RE | Admit: 2012-10-05 | Discharge: 2012-10-05 | Disposition: A | Discharge status: Home / Self Care | Payer: Medicare PPO | Source: Ambulatory Visit | Attending: Radiation Oncology | Admitting: Radiation Oncology

## 2012-10-05 DIAGNOSIS — I2510 Atherosclerotic heart disease of native coronary artery without angina pectoris: Secondary | ICD-10-CM

## 2012-10-05 DIAGNOSIS — E119 Type 2 diabetes mellitus without complications: Secondary | ICD-10-CM

## 2012-10-05 DIAGNOSIS — C3430 Malignant neoplasm of lower lobe, unspecified bronchus or lung: Secondary | ICD-10-CM

## 2012-10-05 DIAGNOSIS — E785 Hyperlipidemia, unspecified: Secondary | ICD-10-CM

## 2012-10-05 NOTE — Progress Notes (Signed)
  Radiation Oncology         [phone] ________________________________  Name: Champion Corales MRN: [id number]  Date: 10/05/2012  DOB: 12-01-1927  Stereotactic Body Radiotherapy Treatment Procedure Note  NARRATIVE:  Leoncio Hansen was brought to the stereotactic radiation treatment machine and placed supine on the CT couch. The patient was set up for stereotactic body radiotherapy on the body fix pillow.  3D TREATMENT PLANNING AND DOSIMETRY:  The patient's radiation plan was reviewed and approved prior to starting treatment.  It showed 3-dimensional radiation distributions overlaid onto the planning CT.  The [facility] for the target structures as well as the organs at risk were reviewed. The documentation of this is filed in the radiation oncology EMR.  SIMULATION VERIFICATION:  The patient underwent CT imaging on the treatment unit.  These were carefully aligned to document that the ablative radiation dose would cover the target volume and maximally spare the nearby organs at risk according to the planned distribution.  SPECIAL TREATMENT PROCEDURE: Pamelia Hoit received high dose ablative stereotactic body radiotherapy to the planned target volume without unforeseen complications. Treatment was delivered uneventfully. The high doses associated with stereotactic body radiotherapy and the significant potential risks require careful treatment set up and patient monitoring constituting a special treatment procedure   STEREOTACTIC TREATMENT MANAGEMENT:  Following delivery, the patient was evaluated clinically. The patient tolerated treatment without significant acute effects, and was discharged to home in stable condition.    PLAN: Continue treatment as planned.  ________________________________  Artist Pais. Kathrynn Running, M.D.

## 2012-10-05 NOTE — Progress Notes (Signed)
  Radiation Oncology         [phone] ________________________________  Name: Hunter Howard MRN: [id number]  Date: 10/05/2012  DOB: March 25, 1927  Weekly Radiation Therapy Management  Current Dose: 40 Gy     Planned Dose:  50 Gy  Narrative . . . . . . . . The patient presents for routine under treatment assessment.                                                     The patient is without complaint.                                 Set-up films were reviewed.                                 The chart was checked. Physical Findings. . . . Weight essentially stable.  No significant changes. Impression . . . . . . . The patient is  tolerating radiation. Plan . . . . . . . . . . . . Continue treatment as planned.  ________________________________  Hunter Howard, M.D.

## 2012-10-08 ENCOUNTER — Encounter: Payer: Self-pay | Admitting: Radiation Oncology

## 2012-10-08 ENCOUNTER — Ambulatory Visit
Admission: RE | Admit: 2012-10-08 | Discharge: 2012-10-08 | Disposition: A | Discharge status: Home / Self Care | Payer: Medicare PPO | Source: Ambulatory Visit | Attending: Radiation Oncology | Admitting: Radiation Oncology

## 2012-10-08 NOTE — Progress Notes (Signed)
  Radiation Oncology         [phone] ________________________________  Name: Ayaansh Smail MRN: [id number]  Date: 10/08/2012  DOB: 1927/01/05  Stereotactic Body Radiotherapy Treatment Procedure Note  NARRATIVE:  Parrish Bonn was brought to the stereotactic radiation treatment machine and placed supine on the CT couch. The patient was set up for stereotactic body radiotherapy on the body fix pillow.  3D TREATMENT PLANNING AND DOSIMETRY:  The patient's radiation plan was reviewed and approved prior to starting treatment.  It showed 3-dimensional radiation distributions overlaid onto the planning CT.  The [facility] for the target structures as well as the organs at risk were reviewed. The documentation of this is filed in the radiation oncology EMR.  SIMULATION VERIFICATION:  The patient underwent CT imaging on the treatment unit.  These were carefully aligned to document that the ablative radiation dose would cover the left lower lung target volume and maximally spare the nearby organs at risk according to the planned distribution.  SPECIAL TREATMENT PROCEDURE: Pamelia Hoit received high dose ablative stereotactic body radiotherapy to the planned target volume without unforeseen complications. Treatment was delivered uneventfully. He received his final fraction of 1000 cGy for a cumulative dose of 5000 cGy in 5 fractions. The high doses associated with stereotactic body radiotherapy and the significant potential risks require careful treatment set up and patient monitoring constituting a special treatment procedure   STEREOTACTIC TREATMENT MANAGEMENT:  Following delivery, the patient was evaluated clinically. The patient tolerated treatment without significant acute effects, and was discharged to home in stable condition.    PLAN: Continue treatment as planned.  ------------------------------------------------        Maryln Gottron, MD

## 2012-10-09 NOTE — Progress Notes (Signed)
  Radiation Oncology         [phone] ________________________________  Name: Hunter Howard MRN: [id number]  Date: 10/01/2012  DOB: 07/02/1927  Stereotactic Body Radiotherapy Treatment Procedure Note  NARRATIVE:  Hunter Howard was brought to the stereotactic radiation treatment machine and placed supine on the CT couch. The patient was set up for stereotactic body radiotherapy on the body fix pillow.  3D TREATMENT PLANNING AND DOSIMETRY:  The patient's radiation plan was reviewed and approved prior to starting treatment.  It showed 3-dimensional radiation distributions overlaid onto the planning CT.  The [facility] for the target structures as well as the organs at risk were reviewed. The documentation of this is filed in the radiation oncology EMR.  SIMULATION VERIFICATION:  The patient underwent CT imaging on the treatment unit.  These were carefully aligned to document that the ablative radiation dose would cover the target volume and maximally spare the nearby organs at risk according to the planned distribution.  SPECIAL TREATMENT PROCEDURE: Hunter Howard received high dose ablative stereotactic body radiotherapy to the planned target volume without unforeseen complications. Treatment was delivered uneventfully. The high doses associated with stereotactic body radiotherapy and the significant potential risks require careful treatment set up and patient monitoring constituting a special treatment procedure   STEREOTACTIC TREATMENT MANAGEMENT:  Following delivery, the patient was evaluated clinically. The patient tolerated treatment without significant acute effects, and was discharged to home in stable condition.    PLAN: Continue treatment as planned.  ________________________________

## 2012-10-09 NOTE — Progress Notes (Signed)
  Radiation Oncology         [phone] ________________________________  Name: Hunter Howard MRN: [id number]  Date: 09/27/2012  DOB: 21-Jul-1927  Stereotactic Body Radiotherapy Treatment Procedure Note  NARRATIVE:  Hunter Howard was brought to the stereotactic radiation treatment machine and placed supine on the CT couch. The patient was set up for stereotactic body radiotherapy on the body fix pillow.  3D TREATMENT PLANNING AND DOSIMETRY:  The patient's radiation plan was reviewed and approved prior to starting treatment.  It showed 3-dimensional radiation distributions overlaid onto the planning CT.  The [facility] for the target structures as well as the organs at risk were reviewed. The documentation of this is filed in the radiation oncology EMR.  SIMULATION VERIFICATION:  The patient underwent CT imaging on the treatment unit.  These were carefully aligned to document that the ablative radiation dose would cover the target volume and maximally spare the nearby organs at risk according to the planned distribution.  SPECIAL TREATMENT PROCEDURE: Hunter Howard received high dose ablative stereotactic body radiotherapy to the planned target volume without unforeseen complications. Treatment was delivered uneventfully. The high doses associated with stereotactic body radiotherapy and the significant potential risks require careful treatment set up and patient monitoring constituting a special treatment procedure   STEREOTACTIC TREATMENT MANAGEMENT:  Following delivery, the patient was evaluated clinically. The patient tolerated treatment without significant acute effects, and was discharged to home in stable condition.    PLAN: Continue treatment as planned.  ________________________________  Artist Pais. Kathrynn Running, M.D.

## 2012-10-09 NOTE — Progress Notes (Signed)
  Radiation Oncology         [phone]) [phone] ________________________________  Name: Hunter Howard MRN: [id number]  Date: 10/08/2012  DOB: [birth year]  End of Treatment Note  Diagnosis:  4.1 cm well differentiated adenocarcinoma of the left lower lobe of the lung (T2a N0 M0 - Stage IB)  Indication for treatment:  Curative Local Control       Radiation treatment dates:   09/25/2012, 09/27/2012, 10/01/2012, 10/05/2012, 10/08/2012   Site/dose:   The tumor was treated to 50 Gy in 5 fractions of 10 Gy.  Beams/energy:   Stereotactic body radiotherapy was delivered with 3-D conformal radiation using 6 megavolt photons delivered to 2 volumetric ARC therapy beams. The patient was set up daily using a body fix immobilization device with respiratory management abdominal compression paddle. Prior to each treatment, the patient was setup using image guidance with cone beam CT for enhanced precision  Narrative: The patient tolerated radiation treatment relatively well.   He did not experience any acute toxicities.             Plan: The patient has completed radiation treatment. The patient will return to radiation oncology clinic for routine followup in one month. I advised them to call or return sooner if they have any questions or concerns related to their recovery or treatment. ________________________________  Artist Pais. Kathrynn Running, M.D.

## 2012-11-07 ENCOUNTER — Encounter: Payer: Self-pay | Admitting: Radiation Oncology

## 2012-11-07 DIAGNOSIS — E785 Hyperlipidemia, unspecified: Secondary | ICD-10-CM | POA: Insufficient documentation

## 2012-11-07 DIAGNOSIS — R0602 Shortness of breath: Secondary | ICD-10-CM | POA: Insufficient documentation

## 2012-11-07 DIAGNOSIS — I2510 Atherosclerotic heart disease of native coronary artery without angina pectoris: Secondary | ICD-10-CM | POA: Insufficient documentation

## 2012-11-07 DIAGNOSIS — I10 Essential (primary) hypertension: Secondary | ICD-10-CM | POA: Insufficient documentation

## 2012-11-07 DIAGNOSIS — I739 Peripheral vascular disease, unspecified: Secondary | ICD-10-CM | POA: Insufficient documentation

## 2012-11-07 DIAGNOSIS — I509 Heart failure, unspecified: Secondary | ICD-10-CM | POA: Insufficient documentation

## 2012-11-07 DIAGNOSIS — F329 Major depressive disorder, single episode, unspecified: Secondary | ICD-10-CM | POA: Insufficient documentation

## 2012-11-07 DIAGNOSIS — D649 Anemia, unspecified: Secondary | ICD-10-CM | POA: Insufficient documentation

## 2012-11-08 ENCOUNTER — Encounter: Payer: Self-pay | Admitting: Radiation Oncology

## 2012-11-08 ENCOUNTER — Ambulatory Visit
Admission: RE | Admit: 2012-11-08 | Discharge: 2012-11-08 | Disposition: A | Discharge status: Home / Self Care | Payer: Medicare PPO | Source: Ambulatory Visit | Attending: Radiation Oncology | Admitting: Radiation Oncology

## 2012-11-08 VITALS — BP 130/72 | HR 80 | Temp 98.2°F | Resp 22 | Wt 198.8 lb

## 2012-11-08 DIAGNOSIS — C3430 Malignant neoplasm of lower lobe, unspecified bronchus or lung: Secondary | ICD-10-CM

## 2012-11-08 NOTE — Progress Notes (Signed)
Radiation Oncology         [phone]) [phone] ________________________________  Name: Hunter Howard MRN: [id number]  Date: 11/08/2012  DOB: 22-Aug-1927  Follow-Up Visit Note  CC: Lynnea Ferrier TOM, MD  Zubelevitskiy, Konstant*  Diagnosis:   4.1 cm well differentiated adenocarcinoma of the left lower lobe of the lung (T2a N0 M0 - Stage IB) s/p 50 Gy in 5 fractions of 10 Gy SBRT from 10/1-10/14/13  Interval Since Last Radiation:  1 months  Narrative:  The patient returns today for routine follow-up.  short of breath with activity, no c/o pain, no coughing, no nausea, head aches or blurred vision, does have glaucoma and takes eye gtts Bid, eating well, and drinking well stated                               ALLERGIES:   has no known allergies.  Meds: Current Outpatient Prescriptions  Medication Sig Dispense Refill  . acetaminophen-codeine (TYLENOL #3) 300-30 MG per tablet Take 1 tablet by mouth as needed.      Marland Kitchen aspirin 81 MG chewable tablet Chew 1 tablet (81 mg total) by mouth daily.  100 tablet  0  . atorvastatin (LIPITOR) 40 MG tablet Take 20 mg by mouth daily.      . cetirizine (ZYRTEC) 10 MG tablet Take 10 mg by mouth daily.        . citalopram (CELEXA) 40 MG tablet Take 40 mg by mouth daily.      . clopidogrel (PLAVIX) 75 MG tablet Take 600 mg today. Then starting 08/18/12 take 1 tablet daily  97 tablet  3  . cyclobenzaprine (FLEXERIL) 10 MG tablet Take 10 mg by mouth as needed.      . ferrous sulfate 325 (65 FE) MG tablet Take 325 mg by mouth daily with breakfast.      . furosemide (LASIX) 20 MG tablet Take 20 mg by mouth daily.      . insulin glargine (LANTUS) 100 UNIT/ML injection Inject 50 Units into the skin daily.       . insulin regular (HUMULIN R) 100 UNIT/ML injection Inject 16 Units into the skin 4 (four) times daily.       Marland Kitchen losartan (COZAAR) 100 MG tablet Take 50 mg by mouth daily.      . meclizine (MEDI-MECLIZINE) 25 MG tablet Take 25 mg by mouth 3 (three) times daily as  needed. For dizziness.      . metFORMIN (GLUCOPHAGE) 850 MG tablet Take 850 mg by mouth 2 (two) times daily.       . nitroGLYCERIN (NITROSTAT) 0.4 MG SL tablet Place 0.4 mg under the tongue every 5 (five) minutes as needed. For chest pain.      Marland Kitchen omeprazole (PRILOSEC) 40 MG capsule Take 40 mg by mouth daily.        . potassium chloride (K-DUR,KLOR-CON) 10 MEQ tablet Take 2 tablets by mouth Daily.      Marland Kitchen zolpidem (AMBIEN) 10 MG tablet Take 10 mg by mouth at bedtime as needed.      . carvedilol (COREG) 3.125 MG tablet Take 3.125 mg by mouth 2 (two) times daily with a meal.        Physical Findings: The patient is in no acute distress. Patient is alert and oriented. Patient arrived walking with a cane, steady gait, alert,oriented x3, 96% room air sats  weight is 198 lb 12.8 oz (90.175 kg). His oral temperature is  98.2 F (36.8 C). His blood pressure is 130/72 and his pulse is 80. His respiration is 22 and oxygen saturation is 96%. .  No significant changes.  Lab Findings: Lab Results  Component Value Date   WBC 5.9 10/03/2012   HGB 15.0 10/03/2012   HCT 44.0 10/03/2012   MCV 100.0* 10/03/2012   PLT 206 10/03/2012     Impression:  The patient is recovering from the effects of radiation.  Plan:  CT on 12/10 and then Dr. Cyndie Chime on 12/13.  Return her in early March 2014  _____________________________________  Artist Pais. Kathrynn Running, M.D.

## 2012-11-08 NOTE — Progress Notes (Signed)
Patient arrived walking with a cane, steady gait, alert,oriented x3, here for follow up s/p rad txs:10/1.13, 09/27/12; 10/01/12; 10/05/12; 10/08/12 50 Gy in 5 fxs 10 Gy 96% room air  sats, short of breath with activity, no c/o pain, no coughing, no nausea, head aches or blurred vision, does have glaucoma and takes eye gtts  Bid, eating well, and drinking well stated 11:04 AM

## 2012-11-29 ENCOUNTER — Encounter: Payer: Self-pay | Admitting: *Deleted

## 2012-12-03 ENCOUNTER — Other Ambulatory Visit: Payer: Self-pay | Admitting: *Deleted

## 2012-12-03 ENCOUNTER — Ambulatory Visit (INDEPENDENT_AMBULATORY_CARE_PROVIDER_SITE_OTHER): Payer: Medicare PPO | Admitting: Cardiovascular Disease

## 2012-12-03 ENCOUNTER — Ambulatory Visit (INDEPENDENT_AMBULATORY_CARE_PROVIDER_SITE_OTHER): Payer: Medicare PPO | Admitting: *Deleted

## 2012-12-03 VITALS — BP 129/80 | HR 81 | Ht 73.0 in | Wt 198.0 lb

## 2012-12-03 DIAGNOSIS — I10 Essential (primary) hypertension: Secondary | ICD-10-CM

## 2012-12-03 DIAGNOSIS — Z79899 Other long term (current) drug therapy: Secondary | ICD-10-CM

## 2012-12-03 DIAGNOSIS — R0602 Shortness of breath: Secondary | ICD-10-CM

## 2012-12-03 DIAGNOSIS — I447 Left bundle-branch block, unspecified: Secondary | ICD-10-CM

## 2012-12-03 LAB — BASIC METABOLIC PANEL
BUN: 14 mg/dL (ref 6–23)
CO2: 25 mEq/L (ref 19–32)
Calcium: 9.1 mg/dL (ref 8.4–10.5)
Chloride: 101 mEq/L (ref 96–112)
Creatinine, Ser: 0.9 mg/dL (ref 0.4–1.5)
Glucose, Bld: 240 mg/dL — ABNORMAL HIGH (ref 70–99)

## 2012-12-03 NOTE — Patient Instructions (Signed)
Your physician wants you to follow-up in:   6 MONTHS WITH DR Haywood Filler will receive a reminder letter in the mail two months in advance. If you don't receive a letter, please call our office to schedule the follow-up appointment. Your physician has recommended you make the following change in your medication: PENDING LAB RESULTS Your physician recommends that you return for lab work in: TODAY BMET BNP  DX  SHORTNESS OF BREATH   V58.69

## 2012-12-03 NOTE — Progress Notes (Signed)
Patient ID: Hunter Howard, male   DOB: 28-Jun-1927, 78 y.o.   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 Patient does not want w/u of this. Had PCI/stent to RCA. Felt well since D/C with  No dyspnea or chest pain. Not taking Plavix !! Restarted metformin. Compliant with meds otherwise. Reviewed his angio and LV dysfunciton dysprop to CAD. Nice result with stent but concerning that he has not taken plavix since D/C. Confirmed with patient that he does not want w/u for lung CA. No cough sputum or hemoptysis Just had biopsy of mass while off Plavix. Path 8/27 Well differentiated adenocarcinoma DES to RCA placed 5/13  Is getting XRT    Echo 5/10 EF 25-30%  ON two different K pills and cannot swallow Klor Kon.  Needs BMET and BNP  Dr Tanya Nones inquired about Digoxen. With his lung and heart disease this is not going to change his dyspnea and he already has stomach upset  ROS: Denies fever, malais, weight loss, blurry vision, decreased visual acuity, cough, sputum, SOB, hemoptysis, pleuritic pain, palpitaitons, heartburn, abdominal pain, melena, lower extremity edema, claudication, or rash.  All other systems reviewed and negative  General: Affect appropriate Chronically ill elderly male HEENT: normal Neck supple with no adenopathy JVP normal no bruits no thyromegaly Lungs clear with no wheezing and good diaphragmatic motion Heart:  S1/S2 no murmur, no rub, gallop or click PMI normal Abdomen: benighn, BS positve, no tenderness, no AAA no bruit.  No HSM or HJR Distal pulses intact with no bruits No edema Neuro non-focal Skin warm and dry No muscular weakness   Current Outpatient Prescriptions  Medication Sig Dispense Refill  . acetaminophen-codeine (TYLENOL #3) 300-30 MG per tablet Take 1 tablet by mouth as needed.      Marland Kitchen aspirin 81 MG chewable tablet Chew 1 tablet (81 mg total) by mouth daily.  100 tablet  0  . atorvastatin (LIPITOR) 40 MG  tablet Take 20 mg by mouth daily.      . brimonidine (ALPHAGAN) 0.2 % ophthalmic solution 1 drop 3 (three) times daily.      . carvedilol (COREG) 3.125 MG tablet Take 3.125 mg by mouth 2 (two) times daily with a meal.      . cetirizine (ZYRTEC) 10 MG tablet Take 10 mg by mouth daily.        . citalopram (CELEXA) 40 MG tablet Take 40 mg by mouth daily.      . clopidogrel (PLAVIX) 75 MG tablet Take 600 mg today. Then starting 08/18/12 take 1 tablet daily  97 tablet  3  . cyclobenzaprine (FLEXERIL) 10 MG tablet Take 10 mg by mouth as needed.      . ferrous sulfate 325 (65 FE) MG tablet Take 325 mg by mouth daily with breakfast.      . furosemide (LASIX) 20 MG tablet Take 20 mg by mouth daily.      . insulin glargine (LANTUS) 100 UNIT/ML injection Inject 50 Units into the skin daily.       . insulin regular (HUMULIN R) 100 UNIT/ML injection Inject 16 Units into the skin 4 (four) times daily.       Marland Kitchen losartan (COZAAR) 100 MG tablet Take 50 mg by mouth daily.      . meclizine (MEDI-MECLIZINE) 25 MG tablet Take 25 mg by mouth 3 (three) times daily as needed. For dizziness.      . metFORMIN (GLUCOPHAGE) 850  MG tablet Take 850 mg by mouth 2 (two) times daily.       . nitroGLYCERIN (NITROSTAT) 0.4 MG SL tablet Place 0.4 mg under the tongue every 5 (five) minutes as needed. For chest pain.      Marland Kitchen omeprazole (PRILOSEC) 40 MG capsule Take 40 mg by mouth daily.        . potassium chloride (K-DUR,KLOR-CON) 10 MEQ tablet Take 2 tablets by mouth Daily.      . Potassium Chloride Crys CR (KLOR-CON M10 PO) Take 1 tablet by mouth daily.      Marland Kitchen zolpidem (AMBIEN) 10 MG tablet Take 10 mg by mouth at bedtime as needed.        Allergies  Review of patient's allergies indicates no known allergies.  Electrocardiogram:  Assessment and Plan

## 2012-12-03 NOTE — Assessment & Plan Note (Signed)
Combination lung CA and CHF  Check BNP  Adjust Lasix and K accordingly No digoxen.  D/C Vicente Serene  F/U CT tomorrow post radiation Rx and F/U with Dr Kathrynn Running for further Rx

## 2012-12-03 NOTE — Assessment & Plan Note (Signed)
Cholesterol is at goal.  Continue current dose of statin and diet Rx.  No myalgias or side effects.  F/U  LFT's in 6 months. No results found for this basename: LDLCALC             

## 2012-12-03 NOTE — Assessment & Plan Note (Signed)
Well controlled.  Continue current medications and low sodium Dash type diet.    

## 2012-12-04 ENCOUNTER — Ambulatory Visit (HOSPITAL_COMMUNITY)
Admission: RE | Admit: 2012-12-04 | Discharge: 2012-12-04 | Disposition: A | Discharge status: Home / Self Care | Payer: Medicare PPO | Source: Ambulatory Visit | Attending: Oncology | Admitting: Oncology

## 2012-12-04 ENCOUNTER — Other Ambulatory Visit (HOSPITAL_BASED_OUTPATIENT_CLINIC_OR_DEPARTMENT_OTHER): Payer: Medicare PPO | Admitting: Lab

## 2012-12-04 DIAGNOSIS — K5730 Diverticulosis of large intestine without perforation or abscess without bleeding: Secondary | ICD-10-CM | POA: Insufficient documentation

## 2012-12-04 DIAGNOSIS — C3430 Malignant neoplasm of lower lobe, unspecified bronchus or lung: Secondary | ICD-10-CM | POA: Insufficient documentation

## 2012-12-04 DIAGNOSIS — I2510 Atherosclerotic heart disease of native coronary artery without angina pectoris: Secondary | ICD-10-CM | POA: Insufficient documentation

## 2012-12-04 DIAGNOSIS — I517 Cardiomegaly: Secondary | ICD-10-CM | POA: Insufficient documentation

## 2012-12-04 DIAGNOSIS — R918 Other nonspecific abnormal finding of lung field: Secondary | ICD-10-CM | POA: Insufficient documentation

## 2012-12-04 DIAGNOSIS — I700 Atherosclerosis of aorta: Secondary | ICD-10-CM | POA: Insufficient documentation

## 2012-12-04 DIAGNOSIS — N289 Disorder of kidney and ureter, unspecified: Secondary | ICD-10-CM | POA: Insufficient documentation

## 2012-12-04 DIAGNOSIS — J438 Other emphysema: Secondary | ICD-10-CM | POA: Insufficient documentation

## 2012-12-04 LAB — COMPREHENSIVE METABOLIC PANEL (CC13)
AST: 14 U/L (ref 5–34)
Alkaline Phosphatase: 109 U/L (ref 40–150)
Glucose: 309 mg/dl — ABNORMAL HIGH (ref 70–99)
Potassium: 4.3 mEq/L (ref 3.5–5.1)
Sodium: 138 mEq/L (ref 136–145)
Total Bilirubin: 0.64 mg/dL (ref 0.20–1.20)
Total Protein: 7.1 g/dL (ref 6.4–8.3)

## 2012-12-04 LAB — CBC WITH DIFFERENTIAL/PLATELET
EOS%: 6.3 % (ref 0.0–7.0)
Eosinophils Absolute: 0.5 10*3/uL (ref 0.0–0.5)
LYMPH%: 13.8 % — ABNORMAL LOW (ref 14.0–49.0)
MCH: 33.1 pg (ref 27.2–33.4)
MCHC: 34 g/dL (ref 32.0–36.0)
MCV: 97.4 fL (ref 79.3–98.0)
MONO%: 11.5 % (ref 0.0–14.0)
NEUT#: 5.7 10*3/uL (ref 1.5–6.5)
Platelets: 210 10*3/uL (ref 140–400)
RBC: 4.29 10*6/uL (ref 4.20–5.82)
RDW: 13.3 % (ref 11.0–14.6)

## 2012-12-07 ENCOUNTER — Ambulatory Visit (HOSPITAL_BASED_OUTPATIENT_CLINIC_OR_DEPARTMENT_OTHER): Payer: Medicare PPO | Admitting: Oncology

## 2012-12-07 ENCOUNTER — Telehealth: Payer: Self-pay | Admitting: Oncology

## 2012-12-07 VITALS — BP 129/75 | HR 88 | Temp 97.7°F | Resp 22 | Ht 73.0 in | Wt 195.3 lb

## 2012-12-07 DIAGNOSIS — C3430 Malignant neoplasm of lower lobe, unspecified bronchus or lung: Secondary | ICD-10-CM

## 2012-12-07 DIAGNOSIS — I739 Peripheral vascular disease, unspecified: Secondary | ICD-10-CM

## 2012-12-07 DIAGNOSIS — I428 Other cardiomyopathies: Secondary | ICD-10-CM

## 2012-12-07 DIAGNOSIS — J988 Other specified respiratory disorders: Secondary | ICD-10-CM

## 2012-12-07 DIAGNOSIS — R918 Other nonspecific abnormal finding of lung field: Secondary | ICD-10-CM

## 2012-12-07 NOTE — Telephone Encounter (Signed)
gv and printed appt schedule for pt for April 2014...the patient aware central scheduling will contact with d/t of ct.

## 2012-12-07 NOTE — Patient Instructions (Signed)
Repeat CT scan in April

## 2012-12-07 NOTE — Progress Notes (Signed)
Hematology and Oncology Follow Up Visit  Hunter Howard [id number] [birth year] 78 y.o. 12/07/2012 4:11 PM   Principle Diagnosis: Encounter Diagnoses  Name Primary?  . Lung mass   . 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 He was uncertain whether or not he wanted further evaluation of this lesion. I encouraged him to have further evaluation. A PET scan showed uptake limited to the solitary lesion in his left lung. A transcutaneous biopsy done August 27 showed well-differentiated adenocarcinoma. Negative for EGFR over expression and negative for the ALK gene mutation. In view of advanced cardiac disease with ejection fraction of 18% and his underlying emphysema, he was treated with primary radiation and surgery. He received a 5 fraction course using a stereotactic body technique with total treatment dose of 50 gray. October 1 through 10/08/2012.  He has dyspnea with mild exertion. Intermittent low-grade cough. Currently no chest pain. Appetite is good. Performance status is good.  Medications: reviewed  Allergies: No Known Allergies  Review of Systems: Constitutional:  He fatigues easily  Respiratory: See above Cardiovascular:  See above Gastrointestinal: No GI complaints Genito-Urinary: Not questioned Musculoskeletal: No muscle or bone pain Neurologic: No headache or change in vision Skin: No rash Remaining ROS negative.  Physical Exam: Blood pressure 129/75, pulse 88, temperature 97.7 F (36.5 C), temperature source Oral, resp. rate 22, height 6\' 1"  (1.854 m), weight 195 lb 4.8 oz (88.587 kg). Wt Readings from Last 3  Encounters:  12/07/12 195 lb 4.8 oz (88.587 kg)  12/03/12 198 lb (89.812 kg)  11/08/12 198 lb 12.8 oz (90.175 kg)     General appearance: Well-nourished Caucasian man HENNT: Pharynx no erythema or exudate Lymph nodes: No adenopathy Breasts: Lungs: Clear to auscultation resonant to percussion Heart: Regular rhythm no murmur or gallop Abdomen: Soft, nontender, no mass, no organomegaly Extremities: No edema, no calf tenderness Vascular: No cyanosis Neurologic: Motor strength 5 over 5 Skin: No rash  Lab Results: Lab Results  Component Value Date   WBC 8.3 12/04/2012   HGB 14.2 12/04/2012   HCT 41.8 12/04/2012   MCV 97.4 12/04/2012   PLT 210 12/04/2012     Chemistry      Component Value Date/Time   NA 138 12/04/2012 0930   NA 135 12/03/2012 1052   K 4.3 12/04/2012 0930   K 3.9 12/03/2012 1052   CL 102 12/04/2012 0930   CL 101 12/03/2012 1052   CO2 26 12/04/2012 0930   CO2 25 12/03/2012 1052   BUN 15.0 12/04/2012 0930   BUN 14 12/03/2012 1052   CREATININE 1.0 12/04/2012 0930   CREATININE 0.9 12/03/2012 1052      Component Value Date/Time   CALCIUM 9.1 12/04/2012 0930   CALCIUM 9.1 12/03/2012 1052   ALKPHOS 109 12/04/2012 0930   ALKPHOS 65 05/04/2012 0455   AST 14 12/04/2012 0930   AST 22 05/04/2012 0455   ALT 14 12/04/2012 0930   ALT 12 05/04/2012 0455   BILITOT 0.64 12/04/2012 0930   BILITOT 0.7 05/04/2012 0455       Radiological Studies: Ct Chest Wo Contrast  12/04/2012  *RADIOLOGY REPORT*  Clinical Data: 73-month follow-up status post radiotherapy for left lower lobe lung cancer.  CT CHEST WITHOUT CONTRAST  Technique:  Multidetector CT imaging of the chest was performed following the standard protocol without IV contrast.  Comparison: Multiple priors, most recently PET CT 08/10/2012.  Findings:  Mediastinum: Heart size is mildly enlarged with apparent dilatation of the left ventricle. There is no significant pericardial fluid, thickening or pericardial calcification.  There is atherosclerosis of the thoracic aorta, the great vessels of the mediastinum and the coronary arteries, including calcified atherosclerotic plaque in the left main, left anterior descending, left circumflex and right coronary arteries. Probable stent in the mid right coronary artery. No pathologically enlarged mediastinal or hilar lymph nodes. Please note that accurate exclusion of hilar adenopathy is limited on noncontrast CT scans.  The esophagus is unremarkable in appearance.  Lungs/Pleura: Again noted is a left lower lobe mass that is slightly smaller in size when compared to prior studies currently measuring 3.6 x 2.8 cm. Surrounding this lesion within the adjacent lung parenchyma of the left lower and upper lobes are new areas of ground-glass attenuation and architectural distortion, likely reflecting evolving expected post radiation changes ( i.e., pneumonitis); this is less likely to represent acute infection. There is a background of mild diffuse bronchial wall thickening with mild paraseptal and centrilobular emphysema, most pronounced in the lung apices.  A few other scattered pulmonary nodules measuring 5 mm or less are similar to prior examinations (example is a 5 mm nodule in the left apex on image 9 of series 5).  Upper Abdomen: Incompletely visualized small low attenuation lesion in the anterior aspect of the upper pole of the right kidney is similar to prior studies and likely to represent a cyst.  Numerous colonic diverticula.  Visualized adrenal glands are normal in appearance bilaterally.  Musculoskeletal: There are no aggressive appearing lytic or blastic lesions noted in the visualized portions of the skeleton.  IMPRESSION: 1. Primary left lower lobe neoplasm has slightly decreased in size, currently measuring 3.6 x 2.8 cm on today's examination, now with some evolving post radiation changes in the surrounding the lung parenchyma.  Continued attention on follow-up studies is  recommended. 2.  Atherosclerosis, including left main and three-vessel coronary artery disease. 3.  Mild cardiomegaly with probable left ventricular dilatation again noted. 4.  Mild diffuse bronchial wall thickening with mild centrilobular and paraseptal emphysema; imaging findings compatible with underlying COPD. 5.  Additional incidental findings, similar to prior examinations, as above.   Original Report Authenticated By: Trudie Reed, M.D.     Impression and Plan: #1. Clinical stage IB well-differentiated adenocarcinoma left lung treated with primary radiation. I personally reviewed the CT images. In my view, he has had about a 40-50% reduction in the size of the primary tumor and there is no new pathology. I'm comfortable that this represents an adequate response to the recent radiation and we should see further improvement over time. I don't think we need to get a PET scan at this time. Plan: Repeat CT scan in followup visit in 4 months  #2. Coronary artery disease status post coronary stent placement x1  #3. Obstructive airway disease  #4. Insulin-dependent diabetes  #5. Hyperlipidemia  #6. Congestive cardiomyopathy with ejection fraction of 15%  #7. Peripheral vascular disease  CC:. Dr. Charlton Haws; Dr. Margaretmary Dys; Dr. Rayne Du   Levert Feinstein, MD 12/13/20134:11 PM

## 2012-12-10 ENCOUNTER — Other Ambulatory Visit: Payer: Self-pay | Admitting: Oncology

## 2012-12-10 DIAGNOSIS — C3430 Malignant neoplasm of lower lobe, unspecified bronchus or lung: Secondary | ICD-10-CM

## 2012-12-11 ENCOUNTER — Telehealth: Payer: Self-pay | Admitting: Oncology

## 2012-12-11 NOTE — Telephone Encounter (Signed)
Talked to patient and gave him appt regarding lab on April 2014 after CT

## 2013-02-04 ENCOUNTER — Other Ambulatory Visit: Payer: Medicare PPO

## 2013-02-09 ENCOUNTER — Other Ambulatory Visit: Payer: Self-pay

## 2013-02-12 ENCOUNTER — Other Ambulatory Visit (INDEPENDENT_AMBULATORY_CARE_PROVIDER_SITE_OTHER): Payer: Medicare PPO

## 2013-02-12 DIAGNOSIS — Z79899 Other long term (current) drug therapy: Secondary | ICD-10-CM

## 2013-02-12 LAB — BASIC METABOLIC PANEL
CO2: 29 mEq/L (ref 19–32)
Calcium: 9.2 mg/dL (ref 8.4–10.5)
Chloride: 104 mEq/L (ref 96–112)
Sodium: 140 mEq/L (ref 135–145)

## 2013-02-28 ENCOUNTER — Telehealth: Payer: Self-pay | Admitting: Oncology

## 2013-02-28 NOTE — Telephone Encounter (Signed)
sw pt gave appt d/t..td °

## 2013-03-04 ENCOUNTER — Encounter: Payer: Self-pay | Admitting: Radiation Oncology

## 2013-03-04 ENCOUNTER — Telehealth: Payer: Self-pay | Admitting: *Deleted

## 2013-03-04 NOTE — Telephone Encounter (Signed)
sw pt gave appt d/t for 04/15/2013@2:45pm...td 

## 2013-03-07 ENCOUNTER — Encounter: Payer: Self-pay | Admitting: Radiation Oncology

## 2013-03-07 ENCOUNTER — Ambulatory Visit
Admission: RE | Admit: 2013-03-07 | Discharge: 2013-03-07 | Disposition: A | Discharge status: Home / Self Care | Payer: Medicare PPO | Source: Ambulatory Visit | Attending: Radiation Oncology | Admitting: Radiation Oncology

## 2013-03-07 HISTORY — DX: Personal history of irradiation: Z92.3

## 2013-03-07 NOTE — Progress Notes (Signed)
Follow up s/p rad tx Llower lung 09/25/12,33r,7th,11th , & 03/08/12 Alert,oriented x3, wearinh hearing aid, no pain , does have sob 95% room air sats, no coughing, no pain, no nausea, reg bowel , fatigued 10:44 AM

## 2013-03-07 NOTE — Progress Notes (Signed)
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  Dyspnea associated with exertion only.  No cough.                                ALLERGIES:  has No Known Allergies.  Meds: Current Outpatient Prescriptions  Medication Sig Dispense Refill  . acetaminophen-codeine (TYLENOL #3) 300-30 MG per tablet Take 1 tablet by mouth as needed.      Marland Kitchen aspirin 81 MG chewable tablet Chew 1 tablet (81 mg total) by mouth daily.  100 tablet  0  . atorvastatin (LIPITOR) 40 MG tablet Take 20 mg by mouth daily.      . brimonidine (ALPHAGAN) 0.2 % ophthalmic solution 1 drop 3 (three) times daily.      . carvedilol (COREG) 3.125 MG tablet Take 3.125 mg by mouth 2 (two) times daily with a meal.      . cetirizine (ZYRTEC) 10 MG tablet Take 10 mg by mouth daily.        . citalopram (CELEXA) 40 MG tablet Take 40 mg by mouth daily.      . clopidogrel (PLAVIX) 75 MG tablet Take 600 mg today. Then starting 08/18/12 take 1 tablet daily  97 tablet  3  . cyclobenzaprine (FLEXERIL) 10 MG tablet Take 10 mg by mouth as needed.      . ferrous sulfate 325 (65 FE) MG tablet Take 325 mg by mouth daily with breakfast.      . furosemide (LASIX) 20 MG tablet Take 20 mg by mouth daily.      . insulin glargine (LANTUS) 100 UNIT/ML injection Inject 50 Units into the skin daily.       . insulin regular (HUMULIN R) 100 UNIT/ML injection Inject 16 Units into the skin 4 (four) times daily.       Marland Kitchen losartan (COZAAR) 100 MG tablet Take 50 mg by mouth daily.      . meclizine (MEDI-MECLIZINE) 25 MG  tablet Take 25 mg by mouth 3 (three) times daily as needed. For dizziness.      . metFORMIN (GLUCOPHAGE) 850 MG tablet Take 850 mg by mouth 2 (two) times daily.       . nitroGLYCERIN (NITROSTAT) 0.4 MG SL tablet Place 0.4 mg under the tongue every 5 (five) minutes as needed. For chest pain.      Marland Kitchen omeprazole (PRILOSEC) 40 MG capsule Take 40 mg by mouth daily.        . Potassium Chloride Crys CR (KLOR-CON M10 PO) Take 1 tablet by mouth daily.      Marland Kitchen zolpidem (AMBIEN) 10 MG tablet Take 10 mg by mouth at bedtime as needed.       No current facility-administered medications for this encounter.    Physical Findings: The patient is in no acute distress. Patient is alert and oriented.  vitals were not taken for this visit..  No significant changes.  Lab Findings: Lab Results  Component  Value Date   WBC 8.3 12/04/2012   HGB 14.2 12/04/2012   HCT 41.8 12/04/2012   MCV 97.4 12/04/2012   PLT 210 12/04/2012    @LASTCHEM @  Radiographic Findings:  Chest CT on 12/04/12 showed that the primary left lower lobe neoplasm has slightly decreased in size, currently measuring 3.6 x 2.8 cm on today's examination, now with some evolving post radiation change.  Impression:  The patient is recovering from the effects of radiation.  He has no evidence of recurrence or persistent disease at this time.  Plan:  The patient will return to radiation oncology clinic in 6 months.  _____________________________________  Artist Pais. Kathrynn Running, M.D.

## 2013-03-19 ENCOUNTER — Telehealth: Payer: Self-pay | Admitting: *Deleted

## 2013-03-19 NOTE — Telephone Encounter (Signed)
Called patient home spoke with wife, patient sleeping,  results of Ct  Given per Dr.Mannng-" Tumor is smaller,good news" wife thanked the MD and staff for this call, will let patient know,  8:31 AM

## 2013-04-02 ENCOUNTER — Other Ambulatory Visit (HOSPITAL_BASED_OUTPATIENT_CLINIC_OR_DEPARTMENT_OTHER): Payer: Medicare PPO | Admitting: Lab

## 2013-04-02 ENCOUNTER — Ambulatory Visit (HOSPITAL_COMMUNITY)
Admission: RE | Admit: 2013-04-02 | Discharge: 2013-04-02 | Disposition: A | Discharge status: Home / Self Care | Payer: Medicare PPO | Source: Ambulatory Visit | Attending: Oncology | Admitting: Oncology

## 2013-04-02 DIAGNOSIS — R911 Solitary pulmonary nodule: Secondary | ICD-10-CM | POA: Insufficient documentation

## 2013-04-02 DIAGNOSIS — I319 Disease of pericardium, unspecified: Secondary | ICD-10-CM | POA: Insufficient documentation

## 2013-04-02 DIAGNOSIS — N281 Cyst of kidney, acquired: Secondary | ICD-10-CM | POA: Insufficient documentation

## 2013-04-02 DIAGNOSIS — C3490 Malignant neoplasm of unspecified part of unspecified bronchus or lung: Secondary | ICD-10-CM | POA: Insufficient documentation

## 2013-04-02 DIAGNOSIS — IMO0002 Reserved for concepts with insufficient information to code with codable children: Secondary | ICD-10-CM | POA: Insufficient documentation

## 2013-04-02 DIAGNOSIS — R222 Localized swelling, mass and lump, trunk: Secondary | ICD-10-CM | POA: Insufficient documentation

## 2013-04-02 DIAGNOSIS — J90 Pleural effusion, not elsewhere classified: Secondary | ICD-10-CM | POA: Insufficient documentation

## 2013-04-02 DIAGNOSIS — R918 Other nonspecific abnormal finding of lung field: Secondary | ICD-10-CM

## 2013-04-02 DIAGNOSIS — C3430 Malignant neoplasm of lower lobe, unspecified bronchus or lung: Secondary | ICD-10-CM

## 2013-04-02 LAB — COMPREHENSIVE METABOLIC PANEL (CC13)
ALT: 11 U/L (ref 0–55)
Albumin: 3.5 g/dL (ref 3.5–5.0)
CO2: 25 mEq/L (ref 22–29)
Calcium: 9.7 mg/dL (ref 8.4–10.4)
Chloride: 105 mEq/L (ref 98–107)
Glucose: 98 mg/dl (ref 70–99)
Sodium: 142 mEq/L (ref 136–145)
Total Bilirubin: 0.64 mg/dL (ref 0.20–1.20)
Total Protein: 7.5 g/dL (ref 6.4–8.3)

## 2013-04-02 LAB — CBC WITH DIFFERENTIAL/PLATELET
Basophils Absolute: 0 10*3/uL (ref 0.0–0.1)
Eosinophils Absolute: 0.5 10*3/uL (ref 0.0–0.5)
HGB: 14.8 g/dL (ref 13.0–17.1)
MCV: 95.2 fL (ref 79.3–98.0)
MONO#: 0.8 10*3/uL (ref 0.1–0.9)
MONO%: 9.3 % (ref 0.0–14.0)
NEUT#: 5.4 10*3/uL (ref 1.5–6.5)
Platelets: 190 10*3/uL (ref 140–400)
RDW: 13.9 % (ref 11.0–14.6)
WBC: 8.4 10*3/uL (ref 4.0–10.3)

## 2013-04-02 LAB — LACTATE DEHYDROGENASE (CC13): LDH: 167 U/L (ref 125–245)

## 2013-04-02 LAB — SEDIMENTATION RATE: Sed Rate: 11 mm/hr (ref 0–16)

## 2013-04-02 MED ORDER — IOHEXOL 300 MG/ML  SOLN
80.0000 mL | Freq: Once | INTRAMUSCULAR | Status: AC | PRN
  Administered 2013-04-02: 80 mL via INTRAVENOUS

## 2013-04-03 ENCOUNTER — Other Ambulatory Visit: Payer: Self-pay | Admitting: Oncology

## 2013-04-03 ENCOUNTER — Encounter: Payer: Self-pay | Admitting: Oncology

## 2013-04-03 NOTE — Progress Notes (Signed)
I called the patient with results of CTs chest done yesterday. Dominant mass in the left lung decreased  consistent with radiation treatment effect. However, there are 2 new lesions in the right lung. One in the right lower lobe has doubled in size from 0.7 to 1.4 cm and the other in the right middle lobe measures 1 cm previously 6 mm.  The patient has an appointment here in 2 weeks for more detailed discussion. He is going to be 79 years old in July. He has decided he does not want aggressive treatment. I looked back and his original biopsy was negative for EGFR over expression and the ALK gene mutation. We'll continue to follow him and consider treatment if tumor begins to grow rapidly or cause symptoms. He is very comfortable with this approach.

## 2013-04-05 ENCOUNTER — Ambulatory Visit: Payer: Medicare PPO | Admitting: Oncology

## 2013-04-06 ENCOUNTER — Other Ambulatory Visit: Payer: Self-pay | Admitting: Family Medicine

## 2013-04-15 ENCOUNTER — Ambulatory Visit (HOSPITAL_BASED_OUTPATIENT_CLINIC_OR_DEPARTMENT_OTHER): Payer: Medicare PPO | Admitting: Nurse Practitioner

## 2013-04-15 ENCOUNTER — Telehealth: Payer: Self-pay | Admitting: Oncology

## 2013-04-15 DIAGNOSIS — I319 Disease of pericardium, unspecified: Secondary | ICD-10-CM

## 2013-04-15 DIAGNOSIS — C3430 Malignant neoplasm of lower lobe, unspecified bronchus or lung: Secondary | ICD-10-CM

## 2013-04-15 DIAGNOSIS — R918 Other nonspecific abnormal finding of lung field: Secondary | ICD-10-CM

## 2013-04-15 DIAGNOSIS — J90 Pleural effusion, not elsewhere classified: Secondary | ICD-10-CM

## 2013-04-15 NOTE — Progress Notes (Signed)
OFFICE PROGRESS NOTE  Interval history:  Hunter Howard is an 79 year old man with advanced cardiopulmonary disease, found to have a moderately large 4 cm well-differentiated adenocarcinoma of the left lower lung, clinical stage Ib (T2 N0 M0), treated with primary radiation.  Restaging chest CT on 04/03/2013 showed decrease in the size of the left lower lobe lung mass. There was new lingular and left lower lobe consolidation which the radiologist commented likely reflects changes from external beam radiation. There were 2 nodules within the right lung which had increased in size when compared to the previous exam (a nodule identified within the posterior right lower lobe measured 1.4 cm as compared to 0.7 cm on the previous study and within the right middle lobe there was a 1 cm nodule that previously measured 6 mm). There was a new left pleural effusion and small pericardial effusion.  He overall feels well. He has a good appetite. He denies pain. He has stable dyspnea on exertion. He is not short of breath at rest.   Objective: Blood pressure 129/69, pulse 92, temperature 97.8 F (36.6 C), temperature source Oral, resp. rate 20, height 6\' 1"  (1.854 m), weight 197 lb 4.8 oz (89.495 kg), SpO2 94.00%.  No thrush or ulceration. No palpable cervical, supraclavicular or axillary lymph nodes. Faint rales at the lung bases left greater than right. Regular cardiac rhythm. Abdomen soft and nontender. No hepatomegaly. Extremities without edema. Motor strength 5 over 5.  Lab Results: Lab Results  Component Value Date   WBC 8.4 04/02/2013   HGB 14.8 04/02/2013   HCT 44.0 04/02/2013   MCV 95.2 04/02/2013   PLT 190 04/02/2013    Chemistry:    Chemistry      Component Value Date/Time   NA 142 04/02/2013 0905   NA 140 02/12/2013 1218   K 4.0 04/02/2013 0905   K 4.0 02/12/2013 1218   CL 105 04/02/2013 0905   CL 104 02/12/2013 1218   CO2 25 04/02/2013 0905   CO2 29 02/12/2013 1218   BUN 13.3 04/02/2013 0905   BUN 17  02/12/2013 1218   CREATININE 0.9 04/02/2013 0905   CREATININE 0.9 02/12/2013 1218      Component Value Date/Time   CALCIUM 9.7 04/02/2013 0905   CALCIUM 9.2 02/12/2013 1218   ALKPHOS 89 04/02/2013 0905   ALKPHOS 65 05/04/2012 0455   AST 16 04/02/2013 0905   AST 22 05/04/2012 0455   ALT 11 04/02/2013 0905   ALT 12 05/04/2012 0455   BILITOT 0.64 04/02/2013 0905   BILITOT 0.7 05/04/2012 0455       Studies/Results: Ct Chest W Contrast  04/02/2013  *RADIOLOGY REPORT*  Clinical Data: Follow up lung cancer  CT CHEST WITH CONTRAST  Technique:  Multidetector CT imaging of the chest was performed following the standard protocol during bolus administration of intravenous contrast.  Contrast: 80mL OMNIPAQUE IOHEXOL 300 MG/ML  SOLN  Comparison: 12/04/2012  Findings: Lungs/pleura: There is a moderate left pleural effusion identified. This is new since the previous exam.  Consolidation is identified within the lingular portion the left lung and the left lower lobe. The previously noted left lower lobe lung mass is again noted measuring 1.6 x 2.1 cm, image 42/series 2.  On the previous exam this measured 2.8 x 3.6 cm. There is a nodule identified within the posterior right lower lobe which measures 1.4 cm, image 42/series 5.  Previously this measured 0.7 cm.  Within the right middle lobe there is a 1 cm nodule,  image 43/series 5.  Previously this measured 6 mm.  Heart/Mediastinum: There is a small pericardial effusion identified, new from previous exam.  The heart size appears normal. No enlarged or enlarging mediastinal or hilar lymph nodes.  Upper abdomen: The adrenal glands both appear normal.  The exophytic cyst arising from the upper pole the right kidney measures 2.1 cm.  This is only partially visualized and inadequately assessed.  Bones/Musculoskeletal:  Review of the visualized bony structures is significant for mild multilevel degenerative disc disease.  IMPRESSION:  1.  Decrease in size of left lower lobe lung mass. 2.   There is new lingular and left lower lobe consolidation which likely reflects changes from external beam radiation. 3.  There are two nodules within the right lung which have increased in size from previous exam. These may be better assessed with follow-up PET CT. 4.  New left pleural effusion and small pericardial effusion.   Original Report Authenticated By: Signa Kell, M.D.     Medications: I have reviewed the patient's current medications.  Assessment/Plan:  1. Clinical stage IB well-differentiated adenocarcinoma of the left lung treated with primary radiation. 2. Restaging CT scan 04/03/2013 with 2 enlarging nodules within the right lung. Likely represent metastatic disease. 3. Coronary artery disease status post coronary stent placement x1. 4. Obstructive airway disease. 5. Insulin-dependent diabetes. 6. Hyperlipidemia. 7. Congestive cardiomyopathy with ejection fraction of 15%. 8. Peripheral vascular disease.  Disposition-Dr. Cyndie Chime reviewed the results of the CT scan and the images on the computer with Hunter Howard and his family. They understand the nodules in the right lung likely represent metastatic disease. He appears to be asymptomatic.  We discussed the option of systemic treatment with chemotherapy. Hunter Howard prefers an observation approach. A CT scan will be repeated at a four-month interval.  He will return for a followup visit with Dr. Cyndie Chime approximately 1 week after the scan. He or his family will contact the office in the interim with any problems.  Patient was seen with Dr. Cyndie Chime.  Lonna Cobb ANP/GNP-BC

## 2013-05-22 ENCOUNTER — Telehealth: Payer: Self-pay | Admitting: Family Medicine

## 2013-05-22 NOTE — Telephone Encounter (Signed)
Pt fell on Sunday.  C/O injury to ribs.  Area increasingly more painful each day since fall and had large bruised area.  Denies SOB but sounded winded over the phone.  Told to go to the [facility] ED (that is closer for him) and be evaluated for poss fractured ribs and poss chest injury.

## 2013-06-10 ENCOUNTER — Ambulatory Visit (INDEPENDENT_AMBULATORY_CARE_PROVIDER_SITE_OTHER): Payer: Medicare HMO

## 2013-06-10 ENCOUNTER — Encounter: Payer: Self-pay | Admitting: Cardiovascular Disease

## 2013-06-10 ENCOUNTER — Ambulatory Visit (INDEPENDENT_AMBULATORY_CARE_PROVIDER_SITE_OTHER): Payer: Medicare HMO | Admitting: Physician Assistant

## 2013-06-10 ENCOUNTER — Telehealth: Payer: Self-pay | Admitting: Family Medicine

## 2013-06-10 ENCOUNTER — Ambulatory Visit: Payer: Self-pay | Admitting: Family Medicine

## 2013-06-10 ENCOUNTER — Encounter: Payer: Self-pay | Admitting: Physician Assistant

## 2013-06-10 VITALS — BP 109/69 | HR 77 | Temp 96.8°F | Ht 73.5 in | Wt 186.0 lb

## 2013-06-10 DIAGNOSIS — R0781 Pleurodynia: Secondary | ICD-10-CM

## 2013-06-10 DIAGNOSIS — S2232XA Fracture of one rib, left side, initial encounter for closed fracture: Secondary | ICD-10-CM

## 2013-06-10 DIAGNOSIS — R079 Chest pain, unspecified: Secondary | ICD-10-CM

## 2013-06-10 DIAGNOSIS — S2239XA Fracture of one rib, unspecified side, initial encounter for closed fracture: Secondary | ICD-10-CM

## 2013-06-10 MED ORDER — TRAMADOL HCL 50 MG PO TABS: 50.0000 mg | ORAL_TABLET | Freq: Three times a day (TID) | ORAL | Status: DC | PRN

## 2013-06-10 NOTE — Progress Notes (Signed)
Subjective:     Patient ID: Hunter Howard, male   DOB: 29-Jun-1927, 79 y.o.   MRN: [id number]  HPI Pt with 2 recent trauma to the L rib area First he ran into a dresser and then fell out of bed Now with pain to the lateral L ribs Sx constant but worse with deep inspir, cough, sneeze, or laughing   Review of Systems  All other systems reviewed and are negative.       Objective:   Physical Exam  Nursing note and vitals reviewed. Pt appears uncomfortable Heart- RRR   Lungs- CTA ++ TTP L distal ribs along the axillary line No crepitus Xray- + Rib fx, will be over-read    Assessment:     Rib Fx    Plan:     OTC NSAIDS Heat/Ice Ultram rx Reviewed splinting techniq Deep breathing exercise  F/U in 2 weeks

## 2013-06-10 NOTE — Telephone Encounter (Signed)
appt made

## 2013-06-10 NOTE — Patient Instructions (Signed)
Rib Fracture  Your caregiver has diagnosed you as having a rib fracture (a break). This can occur by a blow to the chest, by a fall against a hard object, or by violent coughing or sneezing. There may be one or many breaks. Rib fractures may heal on their own within 3 to 8 weeks. The longer healing period is usually associated with a continued cough or other aggravating activities.  HOME CARE INSTRUCTIONS    Avoid strenuous activity. Be careful during activities and avoid bumping the injured rib. Activities that cause pain pull on the fracture site(s) and are best avoided if possible.   Eat a normal, well-balanced diet. Drink plenty of fluids to avoid constipation.   Take deep breaths several times a day to keep lungs free of infection. Try to cough several times a day, splinting the injured area with a pillow. This will help prevent pneumonia.   Do not wear a rib belt or binder. These restrict breathing which can lead to pneumonia.   Only take over-the-counter or prescription medicines for pain, discomfort, or fever as directed by your caregiver.  SEEK MEDICAL CARE IF:   You develop a continual cough, associated with thick or bloody sputum.  SEEK IMMEDIATE MEDICAL CARE IF:    You have a fever.   You have difficulty breathing.   You have nausea (feeling sick to your stomach), vomiting, or abdominal (belly) pain.   You have worsening pain, not controlled with medications.  Document Released: 12/12/2005 Document Revised: 03/05/2012 Document Reviewed: 05/16/2007  [employer] Patient Information 2014 [employer]

## 2013-06-17 ENCOUNTER — Telehealth: Payer: Self-pay | Admitting: Physician Assistant

## 2013-06-17 NOTE — Telephone Encounter (Signed)
Please advise 

## 2013-06-17 NOTE — Telephone Encounter (Signed)
He can increase to 1-2 qid prn

## 2013-06-17 NOTE — Telephone Encounter (Signed)
Patient notified. Will try to increase and if no help he will call back

## 2013-06-18 ENCOUNTER — Other Ambulatory Visit: Payer: Self-pay | Admitting: Physician Assistant

## 2013-06-19 ENCOUNTER — Telehealth: Payer: Self-pay | Admitting: Physician Assistant

## 2013-06-20 ENCOUNTER — Telehealth: Payer: Self-pay | Admitting: Physician Assistant

## 2013-06-20 NOTE — Telephone Encounter (Signed)
Pt was here 06/10/13, he was given #30 for q 8 hrs, taken all, needs refill.

## 2013-06-20 NOTE — Telephone Encounter (Signed)
Done this am 

## 2013-06-21 ENCOUNTER — Ambulatory Visit: Payer: Medicare HMO | Admitting: Physician Assistant

## 2013-07-30 ENCOUNTER — Encounter: Payer: Self-pay | Admitting: Family Medicine

## 2013-07-30 ENCOUNTER — Ambulatory Visit (INDEPENDENT_AMBULATORY_CARE_PROVIDER_SITE_OTHER): Payer: Medicare HMO | Admitting: Family Medicine

## 2013-07-30 VITALS — BP 100/68 | HR 68 | Temp 97.5°F | Resp 22 | Wt 180.0 lb

## 2013-07-30 DIAGNOSIS — S2239XA Fracture of one rib, unspecified side, initial encounter for closed fracture: Secondary | ICD-10-CM

## 2013-07-30 DIAGNOSIS — S2232XA Fracture of one rib, left side, initial encounter for closed fracture: Secondary | ICD-10-CM

## 2013-07-30 MED ORDER — OXYCODONE-ACETAMINOPHEN 5-325 MG PO TABS
1.0000 | ORAL_TABLET | ORAL | Status: DC | PRN
Start: 2013-07-30 — End: 2013-09-23

## 2013-07-30 NOTE — Progress Notes (Signed)
Subjective:    Patient ID: Hunter Howard, male    DOB: 09-20-1927, 79 y.o.   MRN: [id number]  HPI 8 weeks ago, the patient fell and sustained a left seventh rib fracture confirmed on x-ray. The pain was slowly improving, but the patient reinjured himself this week. He was leaning over to pick up an object and felt something pop over the lateral aspect of the left rib cage.  He denies shortness of breath or hemoptysis. He denies fever. He is still having some pleurisy.  He is tender to palpation over the body of the seventh rib. Past Medical History  Diagnosis Date  . Diabetes mellitus   . Hyperlipidemia   . 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 Shortness of breath   . Blood transfusion   . GERD (gastroesophageal reflux disease)   . Coronary artery disease   . CHF (congestive heart failure)   . Dysrhythmia   . Peripheral vascular disease   . Anemia   . Depression   . COPD (chronic obstructive pulmonary disease)     PFT's 04/15/10  FEV1 2.50 (92%) ratio 68 DLC0 78%  . Lung mass 08/21/12    left lower, adenocarcinoma  . 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 Depression   . History of radiation therapy 09/25/12,09/27/12,10/01/12,10/05/12,&10/08/12    LLLlung 50Gy/36fx   Current Outpatient Prescriptions on File Prior to Visit  Medication Sig Dispense Refill  . acetaminophen-codeine (TYLENOL #3) 300-30 MG per tablet Take 1 tablet by mouth as needed.      Marland Kitchen atorvastatin (LIPITOR) 40 MG tablet Take 20 mg by mouth daily.      . brimonidine (ALPHAGAN) 0.2 % ophthalmic solution 1 drop 3 (three) times daily.      . carvedilol (COREG) 3.125 MG tablet Take 3.125 mg by mouth 2 (two) times daily with a meal.      . cetirizine (ZYRTEC) 10 MG tablet Take 10 mg by mouth daily.        . citalopram (CELEXA) 40 MG tablet Take 40 mg by mouth daily.      . clopidogrel (PLAVIX) 75 MG tablet  Take 600 mg today. Then starting 08/18/12 take 1 tablet daily  97 tablet  3  . cyclobenzaprine (FLEXERIL) 10 MG tablet Take 10 mg by mouth as needed.      . furosemide (LASIX) 20 MG tablet Take 20 mg by mouth daily.      . insulin glargine (LANTUS) 100 UNIT/ML injection Inject 50 Units into the skin daily.       . insulin regular (HUMULIN R) 100 UNIT/ML injection Inject 16 Units into the skin 4 (four) times daily. Sliding scale      . losartan (COZAAR) 100 MG tablet Take 50 mg by mouth daily.      . metFORMIN (GLUCOPHAGE) 850 MG tablet       . nitroGLYCERIN (NITROSTAT) 0.4 MG SL tablet Place 0.4 mg under the tongue every 5 (five) minutes as needed. For chest pain.      Marland Kitchen omeprazole (PRILOSEC) 40 MG capsule Take 40 mg by mouth daily.        . Potassium Chloride Crys CR (KLOR-CON M10 PO) Take 1 tablet by mouth daily.      . traMADol (ULTRAM) 50 MG tablet TAKE ONE TABLET BY MOUTH EVERY 8 HOURS AS NEEDED  FOR PAIN  30 tablet  0  . zolpidem (AMBIEN) 10 MG tablet Take 10 mg by mouth at bedtime as needed.      . meclizine (MEDI-MECLIZINE) 25 MG tablet Take 25 mg by mouth 3 (three) times daily as needed. For dizziness.       No current facility-administered medications on file prior to visit.   No Known Allergies History   Social History  . Marital Status: Married    Spouse Name: N/A    Number of Children: N/A  . Years of Education: N/A   Occupational History  . retired    Social History Main Topics  . Smoking status: Former Smoker -- 1.00 packs/day for 25 years    Types: Cigarettes    Quit date: 02/23/1982  . Smokeless tobacco: Never Used  . Alcohol Use: No  . Drug Use: No  . Sexually Active: No   Other Topics Concern  . Not on file   Social History Narrative  . No narrative on file      Review of Systems  All other systems reviewed and are negative.       Objective:   Physical Exam  Vitals reviewed. Cardiovascular: Normal rate, regular rhythm and normal heart sounds.    No murmur heard. Pulmonary/Chest: Effort normal. He has rales (Left lower lobe Rales). He exhibits tenderness (Over the seventh rib in the midaxillary line).  Abdominal: Soft. Bowel sounds are normal. He exhibits no distension. There is no tenderness. There is no rebound and no guarding.          Assessment & Plan:  1. Rib fracture, left, closed, initial encounter The patient reinjured the same left rib fracture.  Recommended Percocet 5/325 one by mouth every 4 hours when necessary pain.  Recommended incentive spirometry. Warned about sedation and altered mental status on narcotic. Recommended discontinuation of tramadol, Tylenol codeine, and Ambien while on Percocet. Anticipate recovery in 8 weeks.

## 2013-08-08 ENCOUNTER — Encounter (HOSPITAL_COMMUNITY): Payer: Self-pay

## 2013-08-08 ENCOUNTER — Other Ambulatory Visit (HOSPITAL_BASED_OUTPATIENT_CLINIC_OR_DEPARTMENT_OTHER): Payer: Commercial Managed Care - HMO

## 2013-08-08 ENCOUNTER — Ambulatory Visit (HOSPITAL_COMMUNITY)
Admission: RE | Admit: 2013-08-08 | Discharge: 2013-08-08 | Disposition: A | Discharge status: Home / Self Care | Payer: Medicare PPO | Source: Ambulatory Visit | Attending: Nurse Practitioner | Admitting: Nurse Practitioner

## 2013-08-08 DIAGNOSIS — Z923 Personal history of irradiation: Secondary | ICD-10-CM | POA: Insufficient documentation

## 2013-08-08 DIAGNOSIS — C3430 Malignant neoplasm of lower lobe, unspecified bronchus or lung: Secondary | ICD-10-CM

## 2013-08-08 DIAGNOSIS — C3490 Malignant neoplasm of unspecified part of unspecified bronchus or lung: Secondary | ICD-10-CM | POA: Insufficient documentation

## 2013-08-08 DIAGNOSIS — J90 Pleural effusion, not elsewhere classified: Secondary | ICD-10-CM | POA: Insufficient documentation

## 2013-08-08 DIAGNOSIS — R918 Other nonspecific abnormal finding of lung field: Secondary | ICD-10-CM | POA: Insufficient documentation

## 2013-08-08 DIAGNOSIS — I319 Disease of pericardium, unspecified: Secondary | ICD-10-CM | POA: Insufficient documentation

## 2013-08-08 LAB — CBC WITH DIFFERENTIAL/PLATELET
BASO%: 0.3 % (ref 0.0–2.0)
HCT: 41.5 % (ref 38.4–49.9)
MCHC: 33.5 g/dL (ref 32.0–36.0)
MONO#: 0.7 10*3/uL (ref 0.1–0.9)
NEUT%: 62.4 % (ref 39.0–75.0)
WBC: 7 10*3/uL (ref 4.0–10.3)
lymph#: 1.5 10*3/uL (ref 0.9–3.3)

## 2013-08-08 LAB — COMPREHENSIVE METABOLIC PANEL (CC13)
ALT: 18 U/L (ref 0–55)
CO2: 25 mEq/L (ref 22–29)
Chloride: 103 mEq/L (ref 98–109)
Sodium: 140 mEq/L (ref 136–145)
Total Protein: 7.4 g/dL (ref 6.4–8.3)

## 2013-08-08 MED ORDER — IOHEXOL 300 MG/ML  SOLN
80.0000 mL | Freq: Once | INTRAMUSCULAR | Status: AC | PRN
  Administered 2013-08-08: 80 mL via INTRAVENOUS

## 2013-08-16 ENCOUNTER — Other Ambulatory Visit: Payer: Medicare PPO | Admitting: Lab

## 2013-08-16 ENCOUNTER — Ambulatory Visit (HOSPITAL_BASED_OUTPATIENT_CLINIC_OR_DEPARTMENT_OTHER): Payer: Medicare PPO | Admitting: Oncology

## 2013-08-16 ENCOUNTER — Telehealth: Payer: Self-pay | Admitting: Oncology

## 2013-08-16 DIAGNOSIS — C7800 Secondary malignant neoplasm of unspecified lung: Secondary | ICD-10-CM

## 2013-08-16 DIAGNOSIS — C7801 Secondary malignant neoplasm of right lung: Secondary | ICD-10-CM

## 2013-08-16 DIAGNOSIS — C3430 Malignant neoplasm of lower lobe, unspecified bronchus or lung: Secondary | ICD-10-CM

## 2013-08-16 DIAGNOSIS — E119 Type 2 diabetes mellitus without complications: Secondary | ICD-10-CM

## 2013-08-16 NOTE — Telephone Encounter (Signed)
gv and printed appt sched and avs for pt for Dec °

## 2013-08-17 NOTE — Progress Notes (Signed)
Hematology and Oncology Follow Up Visit  Hunter Howard [id number] 11-Dec-1927 79 y.o. 08/17/2013 1:33 PM   Principle Diagnosis: Encounter Diagnoses  Name Primary?  . Malignant neoplasm of lower lobe, bronchus, or lung, left Yes  . Lung metastases, right      Interim History:    Followup visit for this pleasant 79 year-old man  found to have a moderately large, 4 centimeter, well-differentiated adenocarcinoma of the left lower lung clinical stage T2 N0, M0 stage IB first noted on a chest radiograph done in May of 2013. He was uncertain whether or not he wanted further evaluation of this lesion.but did eventually agree. A PET scan showed uptake limited to the solitary lesion in his left lung. A transcutaneous biopsy done August 27 showed well-differentiated adenocarcinoma. Negative for EGFR over expression and negative for the ALK gene mutation.  In view of advanced cardiac disease with ejection fraction of 18% and his underlying emphysema, he was treated with primary radiation and surgery. He received a 5 fraction course using a stereotactic body technique with total treatment dose of 50 gray. October 1 through 10/08/2012.  At time of a routine restaging CT scan done on 04/02/2013, he was found to 2 small nodules in the right lung. He was asymptomatic. We mutually agreed for observation alone. Followup study done in anticipation of today's visit on August 14 which I reviewed with him and his family today do show that these 2 lesions are slowly growing. Right upper lobe lesion increased from 1.4 to 2.3 cm and right lower lobe lesion increased from 0.7 x 1 to 2.3 x 1.4 cm.  Main complaint today is persistent left lower anterior rib pain where he sustained a traumatic fracture after falling against a piece of furniture 2 months ago. Rib x-rays done on June 17 showed a mildly displaced fracture of the left seventh rib laterally. Pain has been so significant that his appetite has been down. He feels he has  lost 20 pounds but records indicate weight loss really started before the rib fractures in April. He weighed 197 at that time. 186 at time of rib fractures. 182.5 today.  Medications: reviewed  Allergies: No Known Allergies  Review of Systems: Constitutional:   Generalized weakness Respiratory: Dyspnea on exertion, no cough, no hemoptysis Cardiovascular:  No chest pain or palpitations Gastrointestinal: No change in bowel habit, intermittent constipation from analgesics Genito-Urinary: No urinary tract symptoms Musculoskeletal: See above. No other areas of bone pain except the left rib area Neurologic: No headache or change in vision Skin: No rash or ecchymosis Remaining ROS negative.  Physical Exam: Blood pressure 107/64, pulse 73, temperature 97.3 F (36.3 C), temperature source Oral, resp. rate 18, height 6' 1.5" (1.867 m), weight 182 lb 8 oz (82.781 kg). Wt Readings from Last 3 Encounters:  08/16/13 182 lb 8 oz (82.781 kg)  07/30/13 180 lb (81.647 kg)  06/10/13 186 lb (84.369 kg)     General appearance: Well-nourished Caucasian man HENNT: Pharynx no erythema or exudate Lymph nodes: No adenopathy Breasts: Lungs: Decreased breath sounds left lung base Heart: Regular rhythm no murmur Abdomen: Soft, nontender Extremities: No edema, no calf tenderness Musculoskeletal: No joint deformities. Minimal pain on palpation over the left anterolateral ribs. GU: Vascular: No carotid bruits, no cyanosis Neurologic: Motor strength 5 over 5, reflexes 1+ symmetric Skin: No rash or ecchymosis  Lab Results: Lab Results  Component Value Date   WBC 7.0 08/08/2013   HGB 13.9 08/08/2013   HCT 41.5 08/08/2013  MCV 96.3 08/08/2013   PLT 199 08/08/2013     Chemistry      Component Value Date/Time   NA 140 08/08/2013 0914   NA 140 02/12/2013 1218   K 4.6 08/08/2013 0914   K 4.0 02/12/2013 1218   CL 105 04/02/2013 0905   CL 104 02/12/2013 1218   CO2 25 08/08/2013 0914   CO2 29 02/12/2013 1218    BUN 14.5 08/08/2013 0914   BUN 17 02/12/2013 1218   CREATININE 0.9 08/08/2013 0914   CREATININE 0.9 02/12/2013 1218      Component Value Date/Time   CALCIUM 9.4 08/08/2013 0914   CALCIUM 9.2 02/12/2013 1218   ALKPHOS 100 08/08/2013 0914   ALKPHOS 65 05/04/2012 0455   AST 15 08/08/2013 0914   AST 22 05/04/2012 0455   ALT 18 08/08/2013 0914   ALT 12 05/04/2012 0455   BILITOT 0.58 08/08/2013 0914   BILITOT 0.7 05/04/2012 0455       Radiological Studies: Ct Chest W Contrast  08/08/2013   *RADIOLOGY REPORT*  Clinical Data: Follow-up lung carcinoma.  CT CHEST WITH CONTRAST  Technique:  Multidetector CT imaging of the chest was performed following the standard protocol during bolus administration of intravenous contrast.  Contrast: 80mL OMNIPAQUE IOHEXOL 300 MG/ML  SOLN  Comparison: 04/02/2013  Findings: There is increased consolidation seen in the left lower lobe which now obscures the left lower lobe mass seen previously. Small left pleural effusion and pericardial effusion mild decrease in size since previous study.   Mild airspace disease in the posterior aspect of the lingula shows no significant change.  No centrally obstructing left hilar mass or lymphadenopathy identified.  No evidence of mediastinal or axillary lymphadenopathy.  Pulmonary emphysema again noted. A ill-defined nodule in the posterior right lower lobe has increased in size, currently measuring 2.3 cm on image 48 compared to 1.4 cm previously.  There is also increased size and ill-defined a sub-solid nodule in the inferior aspect of the right middle lobe, currently measuring 2.3 x 1.4 cm on image 44 compared to 7 x 10 mm previously.  Mild cardiomegaly stable.  Both adrenal glands are normal in appearance.  No suspicious bone lesions identified.  IMPRESSION:  1. 1.  Increased size of pulmonary nodules in the right middle and lower lobes.  Malignancy cannot be excluded.  Consider PET CT for further evaluation. 2.  Increased left lower lobe  consolidation now obscures previously seen mass at this location. 3.  Mild decrease in left pleural effusion and pericardial effusion.   Original Report Authenticated By: Myles Rosenthal, M.D.    Impression: #1. Clinical stage IB adenocarcinoma of the left lung EGFR and ALK-negative treated with primary radiation at time of diagnosis in May of 2013. Progression to involve contralateral lung approximately one year later April 2014. He has minimal, nonbulky, asymptomatic disease. He reiterated  his wish not to have any aggressive chemotherapy. We will continue to treat symptoms as they arise. I do believe that his rib fracture was clearly traumatic in origin. He hit a piece of furniture hard. He has no other areas of bone pain. No signs of the fracture was pathologic on regular x-rays. I told him that it is cancer that progressed to bone and cause pain that we could give palliative radiation for pain control. I don't think we need to get imaging studies at this time in that regard. I believe the current rib fracture was clearly traumatic.  #2. Coronary artery disease status post  coronary stent placement #3. Type 2 diabetes #4. Essential hypertension #5. Hyperlipidemia #6. Peripheral vascular disease #7. Obstructive airway disease #8. BPH #9. GERD #10. Chronic congestive heart failure  Plan:   CC:. Dr. Jeani Hawking; Dr. Charlton Haws; Dr. Margaretmary Dys   Levert Feinstein, MD 8/23/20141:33 PM

## 2013-08-28 ENCOUNTER — Telehealth: Payer: Self-pay | Admitting: Dietician

## 2013-08-28 NOTE — Telephone Encounter (Signed)
Brief Outpatient Oncology Nutrition Note  Patient has been identified to be at risk on malnutrition screen.  Wt Readings from Last 10 Encounters:  08/16/13 182 lb 8 oz (82.781 kg)  07/30/13 180 lb (81.647 kg)  06/10/13 186 lb (84.369 kg)  04/15/13 197 lb 4.8 oz (89.495 kg)  03/07/13 191 lb 12.8 oz (87 kg)  12/07/12 195 lb 4.8 oz (88.587 kg)  12/03/12 198 lb (89.812 kg)  11/08/12 198 lb 12.8 oz (90.175 kg)  10/03/12 193 lb 9.6 oz (87.816 kg)  09/06/12 194 lb 9.6 oz (88.27 kg)    Called patient duet to weight loss.  Patient's weight has been stable in the last 2 weeks.  Patient reports eating less but needing to focus on adequate intake to maintain weight.  Decreased appetite and loss of taste buds are main factors contributing to a lower intake.   Encouraged adequate intake for weight maintenance.  Oran Rein, RD, LDN

## 2013-09-05 ENCOUNTER — Ambulatory Visit
Admission: RE | Admit: 2013-09-05 | Discharge: 2013-09-05 | Disposition: A | Discharge status: Home / Self Care | Payer: Medicare PPO | Source: Ambulatory Visit | Attending: Radiation Oncology | Admitting: Radiation Oncology

## 2013-09-05 ENCOUNTER — Encounter: Payer: Self-pay | Admitting: Radiation Oncology

## 2013-09-05 NOTE — Progress Notes (Signed)
Pt reports falling against the edge of a table in June and fracturing his left 7th rib. He was taking Oxycodone but now takes Ibuprofen w/good relief. He states he fell again after the first time. Pt states appetite "comes and goes", is fatigued, SOB w/exertion, denies cough.

## 2013-09-05 NOTE — Progress Notes (Signed)
Radiation Oncology         [id number]) [phone] ________________________________  Name: Hunter Howard MRN: [id number]  Date: 09/05/2013  DOB: 1927-03-03  Follow-Up Visit Note  CC: Leo Grosser, MD  Zubelevitskiy, Konstant*  Diagnosis:   79 yo man with 4.1 cm well differentiated adenocarcinoma of the left lower lobe of the lung (T2a N0 M0 - Stage IB) s/p 50 Gy in 5 fractions of 10 Gy SBRT from 10/1-10/14/13  Interval Since Last Radiation:  10  months  Narrative:  The patient returns today for routine follow-up.  Pt reports falling against the edge of a table in June and fracturing his left 7th rib. He was taking Oxycodone but now takes Ibuprofen w/good relief. He states he fell again after the first time. Pt states appetite "comes and goes", is fatigued, SOB w/exertion, denies cough                              ALLERGIES:  has No Known Allergies.  Meds: Current Outpatient Prescriptions  Medication Sig Dispense Refill  . acetaminophen-codeine (TYLENOL #3) 300-30 MG per tablet Take 1 tablet by mouth as needed.      Marland Kitchen atorvastatin (LIPITOR) 40 MG tablet Take 20 mg by mouth daily.      . brimonidine (ALPHAGAN) 0.2 % ophthalmic solution 1 drop 3 (three) times daily.      . carvedilol (COREG) 3.125 MG tablet Take 3.125 mg by mouth 2 (two) times daily with a meal.      . cetirizine (ZYRTEC) 10 MG tablet Take 10 mg by mouth daily.        . citalopram (CELEXA) 40 MG tablet Take 40 mg by mouth daily.      . clopidogrel (PLAVIX) 75 MG tablet Take 600 mg today. Then starting 08/18/12 take 1 tablet daily  97 tablet  3  . cyclobenzaprine (FLEXERIL) 10 MG tablet Take 10 mg by mouth as needed.      . furosemide (LASIX) 20 MG tablet Take 20 mg by mouth daily.      . insulin glargine (LANTUS) 100 UNIT/ML injection Inject 50 Units into the skin daily.       . insulin regular (HUMULIN R) 100 UNIT/ML injection Inject 16 Units into the skin 4 (four) times daily. Sliding scale      . losartan (COZAAR) 100 MG tablet  Take 50 mg by mouth daily.      . meclizine (MEDI-MECLIZINE) 25 MG tablet Take 25 mg by mouth 3 (three) times daily as needed. For dizziness.      . metFORMIN (GLUCOPHAGE) 850 MG tablet       . nitroGLYCERIN (NITROSTAT) 0.4 MG SL tablet Place 0.4 mg under the tongue every 5 (five) minutes as needed. For chest pain.      Marland Kitchen omeprazole (PRILOSEC) 40 MG capsule Take 40 mg by mouth daily.        Marland Kitchen oxyCODONE-acetaminophen (ROXICET) 5-325 MG per tablet Take 1 tablet by mouth every 4 (four) hours as needed for pain.  60 tablet  0  . Potassium Chloride Crys CR (KLOR-CON M10 PO) Take 1 tablet by mouth daily.      . traMADol (ULTRAM) 50 MG tablet TAKE ONE TABLET BY MOUTH EVERY 8 HOURS AS NEEDED FOR PAIN  30 tablet  0  . zolpidem (AMBIEN) 10 MG tablet Take 10 mg by mouth at bedtime as needed.       No current facility-administered  medications for this encounter.    Physical Findings: The patient is in no acute distress. Patient is alert and oriented.  weight is 181 lb 12.8 oz (82.464 kg). His oral temperature is 97.6 F (36.4 C). His blood pressure is 121/71 and his pulse is 99. His respiration is 20 and oxygen saturation is 96%. .  No significant changes.  Lab Findings: Lab Results  Component Value Date   WBC 7.0 08/08/2013   HGB 13.9 08/08/2013   HCT 41.5 08/08/2013   MCV 96.3 08/08/2013   PLT 199 08/08/2013    @LASTCHEM @  Radiographic Findings: Ct Chest W Contrast  08/08/2013   *RADIOLOGY REPORT*  Clinical Data: Follow-up lung carcinoma.  CT CHEST WITH CONTRAST  Technique:  Multidetector CT imaging of the chest was performed following the standard protocol during bolus administration of intravenous contrast.  Contrast: 80mL OMNIPAQUE IOHEXOL 300 MG/ML  SOLN  Comparison: 04/02/2013  Findings: There is increased consolidation seen in the left lower lobe which now obscures the left lower lobe mass seen previously. Small left pleural effusion and pericardial effusion mild decrease in size since  previous study.   Mild airspace disease in the posterior aspect of the lingula shows no significant change.  No centrally obstructing left hilar mass or lymphadenopathy identified.  No evidence of mediastinal or axillary lymphadenopathy.  Pulmonary emphysema again noted. A ill-defined nodule in the posterior right lower lobe has increased in size, currently measuring 2.3 cm on image 48 compared to 1.4 cm previously.  There is also increased size and ill-defined a sub-solid nodule in the inferior aspect of the right middle lobe, currently measuring 2.3 x 1.4 cm on image 44 compared to 7 x 10 mm previously.  Mild cardiomegaly stable.  Both adrenal glands are normal in appearance.  No suspicious bone lesions identified.  IMPRESSION:  1. 1.  Increased size of pulmonary nodules in the right middle and lower lobes.  Malignancy cannot be excluded.  Consider PET CT for further evaluation. 2.  Increased left lower lobe consolidation now obscures previously seen mass at this location. 3.  Mild decrease in left pleural effusion and pericardial effusion.   Original Report Authenticated By: Myles Rosenthal, M.D.    Impression:  The patient has fibrosis and effusions at the treated left lower lung tumor site.  He also fractured the left 7th rib in the area of the treatment, presumably representing a radiation effect.  Plan:  He does not want aggressive therapy at this point, and is comfortably following up prn which is reasonable.  _____________________________________  Artist Pais. Kathrynn Running, M.D.

## 2013-09-20 ENCOUNTER — Ambulatory Visit (INDEPENDENT_AMBULATORY_CARE_PROVIDER_SITE_OTHER): Payer: Medicare PPO | Admitting: Cardiovascular Disease

## 2013-09-20 ENCOUNTER — Encounter: Payer: Self-pay | Admitting: Cardiovascular Disease

## 2013-09-20 VITALS — BP 120/70 | HR 70 | Ht 74.0 in | Wt 186.0 lb

## 2013-09-20 DIAGNOSIS — R918 Other nonspecific abnormal finding of lung field: Secondary | ICD-10-CM

## 2013-09-20 DIAGNOSIS — I447 Left bundle-branch block, unspecified: Secondary | ICD-10-CM

## 2013-09-20 DIAGNOSIS — I10 Essential (primary) hypertension: Secondary | ICD-10-CM

## 2013-09-20 DIAGNOSIS — I2510 Atherosclerotic heart disease of native coronary artery without angina pectoris: Secondary | ICD-10-CM

## 2013-09-20 DIAGNOSIS — E785 Hyperlipidemia, unspecified: Secondary | ICD-10-CM

## 2013-09-20 DIAGNOSIS — R222 Localized swelling, mass and lump, trunk: Secondary | ICD-10-CM

## 2013-09-20 NOTE — Assessment & Plan Note (Signed)
Having tough time with dyspnea and pain.  He indicated he would rather die quickly of an MI than suffer with this cancer Will forward note to Dr Cyndie Chime and consider Hospice services.  DNR

## 2013-09-20 NOTE — Assessment & Plan Note (Signed)
Well controlled.  Continue current medications and low sodium Dash type diet.    

## 2013-09-20 NOTE — Patient Instructions (Signed)
Your physician recommends that you schedule a follow-up appointment in: AS NEEDED  Your physician recommends that you continue on your current medications as directed. Please refer to the Current Medication list given to you today.  

## 2013-09-20 NOTE — Assessment & Plan Note (Signed)
Stable no changes or advanced AV block

## 2013-09-20 NOTE — Assessment & Plan Note (Signed)
Stable with no angina and good activity level.  Continue medical Rx  

## 2013-09-20 NOTE — Progress Notes (Signed)
Patient ID: Hunter Howard, male   DOB: 1927/07/28, 79 y.o.   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 Patient does not want w/u of this. Had PCI/stent to RCA. Felt well since D/C with  No dyspnea or chest pain. Not taking Plavix !! Restarted metformin. Compliant with meds otherwise. Reviewed his angio and LV dysfunciton dysprop to CAD. Nice result with stent but concerning that he has not taken plavix since D/C. Confirmed with patient that he does not want w/u for lung CA. No cough sputum or hemoptysis Just had biopsy of mass while off Plavix. Path 8/27 Well differentiated adenocarcinoma DES to RCA placed 5/13 Is getting XRT  Echo 5/10 EF 25-30% ON two different K pills and cannot swallow Klor Kon. Needs BMET and BNP Dr Tanya Nones inquired about Digoxen. With his lung and heart disease this is not going to change his dyspnea and he already has stomach upset  Finished XRT  Lots of right sided rib/lung pain from pathologic rib fracture.  He indicated he is tired of living in pain.  Wants to be DNR Suggested he consider Hospice.  Living at [facility] and likes it  ROS: Denies fever, malais, weight loss, blurry vision, decreased visual acuity, cough, sputum, SOB, hemoptysis, pleuritic pain, palpitaitons, heartburn, abdominal pain, melena, lower extremity edema, claudication, or rash.  All other systems reviewed and negative  General: Affect appropriate Healthy:  appears stated age HEENT: normal Neck supple with no adenopathy JVP normal no bruits no thyromegaly Lungs Decreased BS on right with pain to palpation right ribs no  wheezing and good diaphragmatic motion Heart:  S1/S2 no murmur, no rub, gallop or click PMI normal Abdomen: benighn, BS positve, no tenderness, no AAA no bruit.  No HSM or HJR Distal pulses intact with no bruits No edema Neuro non-focal Skin warm and dry No muscular weakness   Current Outpatient Prescriptions   Medication Sig Dispense Refill  . acetaminophen-codeine (TYLENOL #3) 300-30 MG per tablet Take 1 tablet by mouth as needed.      Marland Kitchen atorvastatin (LIPITOR) 40 MG tablet Take 20 mg by mouth daily.      . carvedilol (COREG) 3.125 MG tablet Take 3.125 mg by mouth 2 (two) times daily with a meal.      . cetirizine (ZYRTEC) 10 MG tablet Take 10 mg by mouth daily.        . citalopram (CELEXA) 40 MG tablet Take 40 mg by mouth daily.      . clopidogrel (PLAVIX) 75 MG tablet Take 600 mg today. Then starting 08/18/12 take 1 tablet daily  97 tablet  3  . cyclobenzaprine (FLEXERIL) 10 MG tablet Take 10 mg by mouth as needed.      . furosemide (LASIX) 20 MG tablet Take 20 mg by mouth daily.      . insulin glargine (LANTUS) 100 UNIT/ML injection Inject 50 Units into the skin daily.       . insulin regular (HUMULIN R) 100 UNIT/ML injection Inject 16 Units into the skin 4 (four) times daily. Sliding scale      . losartan (COZAAR) 100 MG tablet Take 50 mg by mouth daily.      . meclizine (MEDI-MECLIZINE) 25 MG tablet Take 25 mg by mouth 3 (three) times daily as needed. For dizziness.      . metFORMIN (GLUCOPHAGE) 850 MG tablet       . nitroGLYCERIN (NITROSTAT) 0.4 MG SL  tablet Place 0.4 mg under the tongue every 5 (five) minutes as needed. For chest pain.      Marland Kitchen oxyCODONE-acetaminophen (ROXICET) 5-325 MG per tablet Take 1 tablet by mouth every 4 (four) hours as needed for pain.  60 tablet  0  . Potassium Chloride Crys CR (KLOR-CON M10 PO) Take 1 tablet by mouth daily.      . traMADol (ULTRAM) 50 MG tablet TAKE ONE TABLET BY MOUTH EVERY 8 HOURS AS NEEDED FOR PAIN  30 tablet  0  . zolpidem (AMBIEN) 10 MG tablet Take 10 mg by mouth at bedtime as needed.      . brimonidine (ALPHAGAN) 0.2 % ophthalmic solution 1 drop 3 (three) times daily.       No current facility-administered medications for this visit.    Allergies  Review of patient's allergies indicates no known allergies.  Electrocardiogram:  SR rate 70  PR 262  LBBB   Assessment and Plan

## 2013-09-20 NOTE — Assessment & Plan Note (Signed)
Cholesterol is at goal.  Continue current dose of statin and diet Rx.  No myalgias or side effects.  F/U  LFT's in 6 months. No results found for this basename: LDLCALC             

## 2013-09-23 ENCOUNTER — Ambulatory Visit (INDEPENDENT_AMBULATORY_CARE_PROVIDER_SITE_OTHER): Payer: Medicare HMO | Admitting: Family Medicine

## 2013-09-23 VITALS — BP 100/54 | HR 68 | Temp 98.0°F | Resp 24 | Ht 74.0 in | Wt 182.0 lb

## 2013-09-23 DIAGNOSIS — C3490 Malignant neoplasm of unspecified part of unspecified bronchus or lung: Secondary | ICD-10-CM

## 2013-09-23 DIAGNOSIS — R079 Chest pain, unspecified: Secondary | ICD-10-CM

## 2013-09-23 DIAGNOSIS — R0781 Pleurodynia: Secondary | ICD-10-CM

## 2013-09-23 DIAGNOSIS — Z23 Encounter for immunization: Secondary | ICD-10-CM

## 2013-09-23 MED ORDER — OXYCODONE HCL ER 15 MG PO T12A: 15.0000 mg | EXTENDED_RELEASE_TABLET | Freq: Two times a day (BID) | ORAL | Status: DC

## 2013-09-23 MED ORDER — OXYCODONE-ACETAMINOPHEN 5-325 MG PO TABS: 1.0000 | ORAL_TABLET | ORAL | Status: DC | PRN

## 2013-09-24 ENCOUNTER — Encounter: Payer: Self-pay | Admitting: Family Medicine

## 2013-09-24 NOTE — Progress Notes (Signed)
Subjective:    Patient ID: Hunter Howard, male    DOB: 1927-07-12, 79 y.o.   MRN: [id number]  HPI  Patient fractured the left seventh rib in May while leaning over a piece of furniture. He is still in tremendous pain. In fact over the weekend he required going to an urgent care to receive pain control medication.  The pain seems to be worsening. It is now constant. It is somewhat worse with deep inspiration. He denies any hemoptysis or shortness of breath. His weight continues to drop. Recent CT scan showed no pathologic skeletal lesions they do show an increasing size of left lower lung mass. The patient has elected not to pursue aggressive treatment for his cancer.  He is primarily interested in pain control. Past Medical History  Diagnosis Date  . Diabetes mellitus   . Hyperlipidemia   . 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 Shortness of breath   . Blood transfusion   . GERD (gastroesophageal reflux disease)   . Coronary artery disease   . CHF (congestive heart failure)   . Dysrhythmia   . Peripheral vascular disease   . Anemia   . Depression   . COPD (chronic obstructive pulmonary disease)     PFT's 04/15/10  FEV1 2.50 (92%) ratio 68 DLC0 78%  . Lung mass 08/21/12    left lower, adenocarcinoma  . 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 Depression   . History of radiation therapy 09/25/12,09/27/12,10/01/12,10/05/12,&10/08/12    LLLlung 50Gy/64fx   Past Surgical History  Procedure Laterality Date  . Cataract extraction    . Colonoscopy  2012  . Enteroscopy  12/16/2011    Procedure: ENTEROSCOPY;  Surgeon: Theda Belfast, MD;  Location: [facility];  Service: Endoscopy;  Laterality: N/A;  . Coronary angioplasty with stent placement    . Mastectomy      Had when 6months old  . Tonsillectomy    . Lung biopsy  08/21/12    LLL   Current Outpatient Prescriptions on  File Prior to Visit  Medication Sig Dispense Refill  . acetaminophen-codeine (TYLENOL #3) 300-30 MG per tablet Take 1 tablet by mouth as needed.      Marland Kitchen atorvastatin (LIPITOR) 40 MG tablet Take 20 mg by mouth daily.      . brimonidine (ALPHAGAN) 0.2 % ophthalmic solution 1 drop 3 (three) times daily.      . carvedilol (COREG) 3.125 MG tablet Take 3.125 mg by mouth 2 (two) times daily with a meal.      . cetirizine (ZYRTEC) 10 MG tablet Take 10 mg by mouth daily.        . citalopram (CELEXA) 40 MG tablet Take 40 mg by mouth daily.      . clopidogrel (PLAVIX) 75 MG tablet Take 600 mg today. Then starting 08/18/12 take 1 tablet daily  97 tablet  3  . cyclobenzaprine (FLEXERIL) 10 MG tablet Take 10 mg by mouth as needed.      . furosemide (LASIX) 20 MG tablet Take 20 mg by mouth daily.      . insulin glargine (LANTUS) 100 UNIT/ML injection Inject 50 Units into the skin daily.       . insulin regular (HUMULIN R) 100 UNIT/ML injection Inject 16 Units into the skin 4 (four) times daily. Sliding scale      .  losartan (COZAAR) 100 MG tablet Take 50 mg by mouth daily.      . meclizine (MEDI-MECLIZINE) 25 MG tablet Take 25 mg by mouth 3 (three) times daily as needed. For dizziness.      . metFORMIN (GLUCOPHAGE) 850 MG tablet       . nitroGLYCERIN (NITROSTAT) 0.4 MG SL tablet Place 0.4 mg under the tongue every 5 (five) minutes as needed. For chest pain.      Marland Kitchen Potassium Chloride Crys CR (KLOR-CON M10 PO) Take 1 tablet by mouth daily.      . traMADol (ULTRAM) 50 MG tablet TAKE ONE TABLET BY MOUTH EVERY 8 HOURS AS NEEDED FOR PAIN  30 tablet  0  . zolpidem (AMBIEN) 10 MG tablet Take 10 mg by mouth at bedtime as needed.       No current facility-administered medications on file prior to visit.   No Known Allergies History   Social History  . Marital Status: Married    Spouse Name: N/A    Number of Children: N/A  . Years of Education: N/A   Occupational History  . retired    Social History Main  Topics  . Smoking status: Former Smoker -- 1.00 packs/day for 25 years    Types: Cigarettes    Quit date: 02/23/1982  . Smokeless tobacco: Never Used  . Alcohol Use: No  . Drug Use: No  . Sexual Activity: No   Other Topics Concern  . Not on file   Social History Narrative  . No narrative on file     Review of Systems  All other systems reviewed and are negative.       Objective:   Physical Exam  Vitals reviewed. Cardiovascular: Normal rate, regular rhythm and normal heart sounds.   Pulmonary/Chest: Effort normal. No respiratory distress. He has wheezes. He has no rales. He exhibits tenderness (To palpation along the ribs on the left side near the area of the nipple and under the armpit).  Abdominal: Soft. Bowel sounds are normal. He exhibits no distension. There is no tenderness. There is no rebound and no guarding.          Assessment & Plan:  1. Need for prophylactic vaccination and inoculation against influenza - Flu Vaccine QUAD 36+ mos IM  2. Rib pain on left side I prescribed the patient OxyContin 15 mg by mouth twice a day. He can continue to use Percocet 10 mg Q8 hours as needed for breakthrough pain. In addition to trying to achieve pain control, I feel that we need to rule out bony metastasis as a cause of the rib fracture. Therefore I am going to order a PET scan to evaluate for skeletal mets in that area.  If positive, we can consider palliative radiation for pain control.

## 2013-10-17 ENCOUNTER — Encounter (HOSPITAL_COMMUNITY): Payer: Self-pay

## 2013-10-17 ENCOUNTER — Encounter (HOSPITAL_COMMUNITY)
Admission: RE | Admit: 2013-10-17 | Discharge: 2013-10-17 | Disposition: A | Discharge status: Home / Self Care | Payer: Medicare HMO | Source: Ambulatory Visit | Attending: Family Medicine | Admitting: Family Medicine

## 2013-10-17 DIAGNOSIS — N200 Calculus of kidney: Secondary | ICD-10-CM | POA: Insufficient documentation

## 2013-10-17 DIAGNOSIS — R918 Other nonspecific abnormal finding of lung field: Secondary | ICD-10-CM | POA: Insufficient documentation

## 2013-10-17 DIAGNOSIS — C3430 Malignant neoplasm of lower lobe, unspecified bronchus or lung: Secondary | ICD-10-CM | POA: Insufficient documentation

## 2013-10-17 DIAGNOSIS — N281 Cyst of kidney, acquired: Secondary | ICD-10-CM | POA: Insufficient documentation

## 2013-10-17 DIAGNOSIS — N400 Enlarged prostate without lower urinary tract symptoms: Secondary | ICD-10-CM | POA: Insufficient documentation

## 2013-10-17 DIAGNOSIS — J90 Pleural effusion, not elsewhere classified: Secondary | ICD-10-CM | POA: Insufficient documentation

## 2013-10-17 DIAGNOSIS — K4090 Unilateral inguinal hernia, without obstruction or gangrene, not specified as recurrent: Secondary | ICD-10-CM | POA: Insufficient documentation

## 2013-10-17 DIAGNOSIS — I2510 Atherosclerotic heart disease of native coronary artery without angina pectoris: Secondary | ICD-10-CM | POA: Insufficient documentation

## 2013-10-17 DIAGNOSIS — I517 Cardiomegaly: Secondary | ICD-10-CM | POA: Insufficient documentation

## 2013-10-17 LAB — GLUCOSE, CAPILLARY: Glucose-Capillary: 124 mg/dL — ABNORMAL HIGH (ref 70–99)

## 2013-10-17 MED ORDER — FLUDEOXYGLUCOSE F - 18 (FDG) INJECTION
17.6000 | Freq: Once | INTRAVENOUS | Status: AC | PRN
  Administered 2013-10-17: 17.6 via INTRAVENOUS

## 2013-10-18 ENCOUNTER — Telehealth: Payer: Self-pay | Admitting: Family Medicine

## 2013-10-18 NOTE — Telephone Encounter (Addendum)
Patient calling in regards to his PET Scan.  Patient had this done yesterday.

## 2013-10-18 NOTE — Telephone Encounter (Signed)
Pt aware.

## 2013-10-25 ENCOUNTER — Ambulatory Visit (INDEPENDENT_AMBULATORY_CARE_PROVIDER_SITE_OTHER): Payer: Medicare HMO | Admitting: Family Medicine

## 2013-10-25 ENCOUNTER — Encounter: Payer: Self-pay | Admitting: Family Medicine

## 2013-10-25 VITALS — BP 110/64 | HR 80 | Temp 97.8°F | Resp 18 | Ht 74.0 in | Wt 181.0 lb

## 2013-10-25 DIAGNOSIS — R0781 Pleurodynia: Secondary | ICD-10-CM

## 2013-10-25 DIAGNOSIS — R079 Chest pain, unspecified: Secondary | ICD-10-CM

## 2013-10-25 MED ORDER — OXYCODONE HCL ER 20 MG PO T12A: 20.0000 mg | EXTENDED_RELEASE_TABLET | Freq: Two times a day (BID) | ORAL | Status: DC

## 2013-10-25 NOTE — Progress Notes (Signed)
Subjective:    Patient ID: Hunter Howard, male    DOB: 22-Oct-1927, 79 y.o.   MRN: [id number]  HPI 09/23/13  Patient fractured the left seventh rib in May while leaning over a piece of furniture. He is still in tremendous pain. In fact over the weekend he required going to an urgent care to receive pain control medication.  The pain seems to be worsening. It is now constant. It is somewhat worse with deep inspiration. He denies any hemoptysis or shortness of breath. His weight continues to drop. Recent CT scan showed no pathologic skeletal lesions they do show an increasing size of left lower lung mass. The patient has elected not to pursue aggressive treatment for his cancer.  He is primarily interested in pain control.  At that time my plan was: 1. Need for prophylactic vaccination and inoculation against influenza - Flu Vaccine QUAD 36+ mos IM  2. Rib pain on left side I prescribed the patient OxyContin 15 mg by mouth twice a day. He can continue to use Percocet 10 mg Q8 hours as needed for breakthrough pain. In addition to trying to achieve pain control, I feel that we need to rule out bony metastasis as a cause of the rib fracture. Therefore I am going to order a PET scan to evaluate for skeletal mets in that area.  If positive, we can consider palliative radiation for pain control.  PET scan revealed: Left lower lobe atelectasis/collapse with associated  hypermetabolism, max SUV 9.5. While this appearance may reflect  compressive atelectasis and radiation changes, residual tumor is not  excluded.  Associated small left pleural effusion.  2.4 cm spiculated nodule in the posterior right middle lobe, max SUV  2.5, suspicious for metastasis.  2.3 cm nodule in the posterior right lower lobe, max SUV 4.5,  suspicious for metastasis.  There is no evidence of bony metastasis that can be treated with radiation. Therefore I started the patient on OxyContin 50 mg by mouth twice a day and Percocet as  needed for breakthrough. The patient states this has helped his pain dramatically. He is interested in trying to increase the pain medicine somewhat as he continues to have daily constant pain in his left side beneath his left breast. Some of this pain may be due to the lung cancer and the majority is due to the rib fracture. Past Medical History  Diagnosis Date  . Diabetes mellitus   . Hyperlipidemia   . 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 Shortness of breath   . Blood transfusion   . GERD (gastroesophageal reflux disease)   . Coronary artery disease   . CHF (congestive heart failure)   . Dysrhythmia   . Peripheral vascular disease   . Anemia   . Depression   . COPD (chronic obstructive pulmonary disease)     PFT's 04/15/10  FEV1 2.50 (92%) ratio 68 DLC0 78%  . Lung mass 08/21/12    left lower, adenocarcinoma  . 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 Depression   . History of radiation therapy 09/25/12,09/27/12,10/01/12,10/05/12,&10/08/12    LLLlung 50Gy/5fx   Past Surgical History  Procedure Laterality Date  . Cataract extraction    . Colonoscopy  2012  . Enteroscopy  12/16/2011    Procedure: ENTEROSCOPY;  Surgeon: Theda Belfast, MD;  Location: [facility];  Service: Endoscopy;  Laterality: N/A;  . Coronary angioplasty with stent placement    . Mastectomy      Had when 6months old  . Tonsillectomy    . Lung biopsy  08/21/12    LLL   Current Outpatient Prescriptions on File Prior to Visit  Medication Sig Dispense Refill  . atorvastatin (LIPITOR) 40 MG tablet Take 20 mg by mouth daily.      . brimonidine (ALPHAGAN) 0.2 % ophthalmic solution 1 drop 3 (three) times daily.      . carvedilol (COREG) 3.125 MG tablet Take 3.125 mg by mouth 2 (two) times daily with a meal.      . cetirizine (ZYRTEC) 10 MG tablet Take 10 mg by mouth daily.        . citalopram  (CELEXA) 40 MG tablet Take 40 mg by mouth daily.      . clopidogrel (PLAVIX) 75 MG tablet Take 600 mg today. Then starting 08/18/12 take 1 tablet daily  97 tablet  3  . furosemide (LASIX) 20 MG tablet Take 20 mg by mouth daily.      . insulin glargine (LANTUS) 100 UNIT/ML injection Inject 50 Units into the skin daily.       . insulin regular (HUMULIN R) 100 UNIT/ML injection Inject 16 Units into the skin 4 (four) times daily. Sliding scale      . losartan (COZAAR) 100 MG tablet Take 50 mg by mouth daily.      . nitroGLYCERIN (NITROSTAT) 0.4 MG SL tablet Place 0.4 mg under the tongue every 5 (five) minutes as needed. For chest pain.      . OxyCODONE (OXYCONTIN) 15 mg T12A 12 hr tablet Take 1 tablet (15 mg total) by mouth every 12 (twelve) hours.  60 tablet  0  . oxyCODONE-acetaminophen (ROXICET) 5-325 MG per tablet Take 1 tablet by mouth every 4 (four) hours as needed for pain.  60 tablet  0  . Potassium Chloride Crys CR (KLOR-CON M10 PO) Take 2 tablets by mouth daily.       . cyclobenzaprine (FLEXERIL) 10 MG tablet Take 10 mg by mouth as needed.      . meclizine (MEDI-MECLIZINE) 25 MG tablet Take 25 mg by mouth 3 (three) times daily as needed. For dizziness.      Marland Kitchen zolpidem (AMBIEN) 10 MG tablet Take 10 mg by mouth at bedtime as needed.       No current facility-administered medications on file prior to visit.   No Known Allergies History   Social History  . Marital Status: Married    Spouse Name: N/A    Number of Children: N/A  . Years of Education: N/A   Occupational History  . retired    Social History Main Topics  . Smoking status: Former Smoker -- 1.00 packs/day for 25 years    Types: Cigarettes    Quit date: 02/23/1982  . Smokeless tobacco: Never Used  . Alcohol Use: No  . Drug Use: No  . Sexual Activity: No   Other Topics Concern  . Not on file   Social History Narrative  . No narrative on file     Review of Systems  All other systems reviewed and are  negative.       Objective:   Physical Exam  Vitals reviewed. Cardiovascular: Normal rate, regular rhythm and normal heart sounds.   Pulmonary/Chest: Effort normal. No respiratory distress. He has wheezes. He has no rales. He exhibits tenderness (To palpation  along the ribs on the left side near the area of the nipple and under the armpit).  Abdominal: Soft. Bowel sounds are normal. He exhibits no distension. There is no tenderness. There is no rebound and no guarding.          Assessment & Plan:  1. Rib pain on left side Increase OxyContin to 20 mg by mouth twice a day. I gave the patient 60 tablets. I refilled his every month.  He can continue to use Percocet 10/325 6 hours as needed for breakthrough pain.

## 2013-11-07 ENCOUNTER — Other Ambulatory Visit: Payer: Self-pay | Admitting: *Deleted

## 2013-11-07 MED ORDER — OXYCODONE HCL ER 15 MG PO T12A: 15.0000 mg | EXTENDED_RELEASE_TABLET | Freq: Two times a day (BID) | ORAL | Status: DC

## 2013-11-07 NOTE — Telephone Encounter (Signed)
Med refilled and signed by provider

## 2013-11-08 ENCOUNTER — Other Ambulatory Visit: Payer: Self-pay | Admitting: Family Medicine

## 2013-11-08 MED ORDER — OXYCODONE-ACETAMINOPHEN 5-325 MG PO TABS: 1.0000 | ORAL_TABLET | ORAL | Status: DC | PRN

## 2013-11-08 NOTE — Telephone Encounter (Signed)
Wrong rx was printed for pt as he did not need the oxycontin but did need the oxycodone. Pts dtr given rx for oxycodone and oxycontin rx shredded.

## 2013-11-26 ENCOUNTER — Telehealth: Payer: Self-pay | Admitting: Family Medicine

## 2013-11-26 MED ORDER — OXYCODONE HCL ER 20 MG PO T12A: 20.0000 mg | EXTENDED_RELEASE_TABLET | Freq: Every day | ORAL | Status: DC

## 2013-11-26 MED ORDER — OXYCODONE-ACETAMINOPHEN 5-325 MG PO TABS: 1.0000 | ORAL_TABLET | ORAL | Status: DC | PRN

## 2013-11-26 NOTE — Telephone Encounter (Signed)
The Oxycontin 20 BID is leaving him to sleepy and unsteady.  Wants to know if can only take the Oxycodone prn during the day and take Oxycontin only in evening before bed to help get through the night.  Please advise.

## 2013-11-26 NOTE — Telephone Encounter (Signed)
Pt called.  He states he now needs refills.  Rx's printed for provider signature

## 2013-11-26 NOTE — Telephone Encounter (Signed)
That would be fine to do.  °

## 2013-12-06 ENCOUNTER — Ambulatory Visit (HOSPITAL_COMMUNITY)
Admission: RE | Admit: 2013-12-06 | Discharge: 2013-12-06 | Disposition: A | Discharge status: Home / Self Care | Payer: Medicare HMO | Source: Ambulatory Visit | Attending: Oncology | Admitting: Oncology

## 2013-12-06 ENCOUNTER — Other Ambulatory Visit (HOSPITAL_BASED_OUTPATIENT_CLINIC_OR_DEPARTMENT_OTHER): Payer: Medicare HMO

## 2013-12-06 ENCOUNTER — Other Ambulatory Visit (HOSPITAL_COMMUNITY): Payer: Medicare PPO

## 2013-12-06 ENCOUNTER — Ambulatory Visit (HOSPITAL_BASED_OUTPATIENT_CLINIC_OR_DEPARTMENT_OTHER): Payer: Medicare HMO | Admitting: Nurse Practitioner

## 2013-12-06 ENCOUNTER — Telehealth: Payer: Self-pay | Admitting: Oncology

## 2013-12-06 DIAGNOSIS — C7801 Secondary malignant neoplasm of right lung: Secondary | ICD-10-CM

## 2013-12-06 DIAGNOSIS — J984 Other disorders of lung: Secondary | ICD-10-CM | POA: Insufficient documentation

## 2013-12-06 DIAGNOSIS — S2239XA Fracture of one rib, unspecified side, initial encounter for closed fracture: Secondary | ICD-10-CM | POA: Insufficient documentation

## 2013-12-06 DIAGNOSIS — R0602 Shortness of breath: Secondary | ICD-10-CM | POA: Insufficient documentation

## 2013-12-06 DIAGNOSIS — C3430 Malignant neoplasm of lower lobe, unspecified bronchus or lung: Secondary | ICD-10-CM

## 2013-12-06 DIAGNOSIS — R079 Chest pain, unspecified: Secondary | ICD-10-CM | POA: Insufficient documentation

## 2013-12-06 DIAGNOSIS — C3490 Malignant neoplasm of unspecified part of unspecified bronchus or lung: Secondary | ICD-10-CM | POA: Insufficient documentation

## 2013-12-06 DIAGNOSIS — X58XXXA Exposure to other specified factors, initial encounter: Secondary | ICD-10-CM | POA: Insufficient documentation

## 2013-12-06 LAB — CBC WITH DIFFERENTIAL/PLATELET
BASO%: 0.8 % (ref 0.0–2.0)
Basophils Absolute: 0.1 10*3/uL (ref 0.0–0.1)
Eosinophils Absolute: 0.7 10*3/uL — ABNORMAL HIGH (ref 0.0–0.5)
HCT: 41 % (ref 38.4–49.9)
HGB: 13.8 g/dL (ref 13.0–17.1)
LYMPH%: 26.7 % (ref 14.0–49.0)
MCHC: 33.6 g/dL (ref 32.0–36.0)
MONO#: 0.7 10*3/uL (ref 0.1–0.9)
NEUT#: 4.2 10*3/uL (ref 1.5–6.5)
NEUT%: 54.2 % (ref 39.0–75.0)
Platelets: 215 10*3/uL (ref 140–400)
WBC: 7.7 10*3/uL (ref 4.0–10.3)
lymph#: 2.1 10*3/uL (ref 0.9–3.3)

## 2013-12-06 LAB — COMPREHENSIVE METABOLIC PANEL (CC13)
ALT: 14 U/L (ref 0–55)
Anion Gap: 9 mEq/L (ref 3–11)
CO2: 26 mEq/L (ref 22–29)
Calcium: 10.1 mg/dL (ref 8.4–10.4)
Chloride: 106 mEq/L (ref 98–109)
Creatinine: 0.7 mg/dL (ref 0.7–1.3)
Glucose: 71 mg/dl (ref 70–140)
Total Bilirubin: 0.82 mg/dL (ref 0.20–1.20)

## 2013-12-06 NOTE — Telephone Encounter (Signed)
Gave pt appt for lab and Md for [facility] 2015 °

## 2013-12-06 NOTE — Progress Notes (Signed)
OFFICE PROGRESS NOTE  Interval history:  [name] is an 79 year old man with clinical stage IB adenocarcinoma of the left lung treated with primary radiation at time of diagnosis May 2013. He had progression to involve the contralateral lung approximately 1 year later in April 2014. He declined any aggressive treatment.  He was referred for a PET scan by Dr. Tanya Nones on 10/17/2013 due to persistent left sided rib pain. The PET scan showed no focal hypermetabolic activity to suggest skeletal metastasis. Left lower lobe atelectasis/collapse with associated hypermetabolism, max SUV 9.5, noted. Associated small left pleural effusion. There was a 2.4 cm spiculated nodule in the posterior right middle lobe with max SUV 2.5 and a 2.3 cm nodule in the posterior right lower lobe max SUV of 4.5.  He is seen today for scheduled followup. He continues to have left sided rib pain. He is taking OxyContin 20 mg once a day with oxycodone as needed. He denies any other sites of pain. He has stable dyspnea on exertion. No fever or cough. Appetite is "a little better". He notes constipation with the pain medications and takes MiraLAX as needed.   Objective: Blood pressure 130/69, pulse 64, temperature 97 F (36.1 C), temperature source Oral, resp. rate 18, height 6\' 2"  (1.88 m), weight 181 lb 3.2 oz (82.192 kg).  No palpable cervical, supraclavicular or axillary lymph nodes. Breath sounds diminished at the left base. Inspiratory rales at the left base. Lungs otherwise clear. Regular cardiac rhythm. Abdomen soft and nontender. No organomegaly. Extremities without edema. Motor strength 5 over 5. Knee DTRs 1+, symmetric.  Lab Results: Lab Results  Component Value Date   WBC 7.7 12/06/2013   HGB 13.8 12/06/2013   HCT 41.0 12/06/2013   MCV 98.5* 12/06/2013   PLT 215 12/06/2013    Chemistry:    Chemistry      Component Value Date/Time   NA 141 12/06/2013 1049   NA 140 02/12/2013 1218   K 4.2 12/06/2013 1049   K 4.0 02/12/2013 1218   CL 105 04/02/2013 0905   CL 104 02/12/2013 1218   CO2 26 12/06/2013 1049   CO2 29 02/12/2013 1218   BUN 12.7 12/06/2013 1049   BUN 17 02/12/2013 1218   CREATININE 0.7 12/06/2013 1049   CREATININE 0.9 02/12/2013 1218      Component Value Date/Time   CALCIUM 10.1 12/06/2013 1049   CALCIUM 9.2 02/12/2013 1218   ALKPHOS 91 12/06/2013 1049   ALKPHOS 65 05/04/2012 0455   AST 18 12/06/2013 1049   AST 22 05/04/2012 0455   ALT 14 12/06/2013 1049   ALT 12 05/04/2012 0455   BILITOT 0.82 12/06/2013 1049   BILITOT 0.7 05/04/2012 0455       Studies/Results: Dg Chest 2 View  12/06/2013   CLINICAL DATA:  Shortness of breath, chest pain, lung cancer.  EXAM: CHEST  2 VIEW  COMPARISON:  June 10, 2013.  FINDINGS: Stable cardiomediastinal silhouette. Right lung is clear. Stable mild left pleural effusion is noted. Large rounded density is noted in left lower lobe which is unchanged compared to prior exam, and may reflect scarring, atelectasis or possibly neoplasm. No pneumothorax is noted. Stable left 7th rib fracture is noted.  IMPRESSION: Left basilar opacity and associated pleural effusion or thickening is unchanged compared to prior exam. Stable left 7th rib fracture.   Electronically Signed   By: Roque Lias M.D.   On: 12/06/2013 12:21    Medications: I have reviewed the patient's current medications.  Assessment/Plan:  1. Clinical stage IB well-differentiated adenocarcinoma of the left lung treated with primary radiation. Progression to involve contralateral lung April 2014. He declined aggressive treatment. 2. Coronary artery disease status post coronary stent placement x1. 3. Obstructive airway disease. 4. Insulin-dependent diabetes. 5. Hyperlipidemia. 6. Congestive cardiomyopathy with ejection fraction of 15%. 7. Peripheral vascular disease. 8. Persistent left lower anterior rib pain. No evidence of bone metastases on PET scan 10/17/2013. He is currently taking OxyContin  with oxycodone as needed.  Disposition-his clinical status appears unchanged. He will return for a followup visit in 3-4 months. He will contact the office in the interim with any problems.  Lonna Cobb ANP/GNP-BC

## 2013-12-11 ENCOUNTER — Telehealth: Payer: Self-pay | Admitting: *Deleted

## 2013-12-11 NOTE — Telephone Encounter (Signed)
Spoke with patient.  Let him know that CXR is stable, there are no new findings.  He appreciated the call.

## 2013-12-11 NOTE — Telephone Encounter (Signed)
Message copied by Orbie Hurst on Wed Dec 11, 2013  2:15 PM ------      Message from: Levert Feinstein      Created: Sat Dec 07, 2013  3:34 PM       Call  CXR stable - no new findings ------

## 2014-02-22 ENCOUNTER — Telehealth: Payer: Self-pay | Admitting: *Deleted

## 2014-02-22 ENCOUNTER — Encounter: Payer: Self-pay | Admitting: Oncology

## 2014-02-22 NOTE — Telephone Encounter (Signed)
Mailed provider change letter w/ calendar to pt and cancelled Dr. Synthia Innocent appt.

## 2014-03-04 ENCOUNTER — Ambulatory Visit
Admission: RE | Admit: 2014-03-04 | Discharge: 2014-03-04 | Disposition: A | Discharge status: Home / Self Care | Payer: Medicare HMO | Source: Ambulatory Visit | Attending: Family Medicine | Admitting: Family Medicine

## 2014-03-04 ENCOUNTER — Encounter: Payer: Self-pay | Admitting: Family Medicine

## 2014-03-04 ENCOUNTER — Ambulatory Visit (INDEPENDENT_AMBULATORY_CARE_PROVIDER_SITE_OTHER): Payer: Medicare Other | Admitting: Family Medicine

## 2014-03-04 VITALS — BP 120/68 | HR 96 | Temp 96.9°F | Resp 26 | Ht 74.0 in | Wt 190.0 lb

## 2014-03-04 DIAGNOSIS — R5383 Other fatigue: Principal | ICD-10-CM

## 2014-03-04 DIAGNOSIS — R5381 Other malaise: Secondary | ICD-10-CM

## 2014-03-04 LAB — URINALYSIS, ROUTINE W REFLEX MICROSCOPIC
Bilirubin Urine: NEGATIVE
Glucose, UA: 100 mg/dL — AB
NITRITE: NEGATIVE
PH: 5.5 (ref 5.0–8.0)
Protein, ur: 30 mg/dL — AB
Specific Gravity, Urine: >1.03 — ABNORMAL HIGH (ref 1.005–1.030)
UROBILINOGEN UA: 0.2 mg/dL (ref 0.0–1.0)

## 2014-03-04 LAB — URINALYSIS, MICROSCOPIC ONLY
Casts: NONE SEEN
Crystals: NONE SEEN

## 2014-03-04 LAB — COMPLETE METABOLIC PANEL WITH GFR
ALBUMIN: 4.1 g/dL (ref 3.5–5.2)
ALT: 14 U/L (ref 0–53)
AST: 16 U/L (ref 0–37)
Alkaline Phosphatase: 84 U/L (ref 39–117)
BUN: 17 mg/dL (ref 6–23)
CHLORIDE: 101 meq/L (ref 96–112)
CO2: 24 meq/L (ref 19–32)
Calcium: 9 mg/dL (ref 8.4–10.5)
Creat: 0.77 mg/dL (ref 0.50–1.35)
GFR, EST NON AFRICAN AMERICAN: 83 mL/min
GLUCOSE: 132 mg/dL — AB (ref 70–99)
POTASSIUM: 3.9 meq/L (ref 3.5–5.3)
SODIUM: 138 meq/L (ref 135–145)
TOTAL PROTEIN: 7.2 g/dL (ref 6.0–8.3)
Total Bilirubin: 0.6 mg/dL (ref 0.2–1.2)

## 2014-03-04 LAB — CBC WITH DIFFERENTIAL/PLATELET
Basophils Absolute: 0 10*3/uL (ref 0.0–0.1)
Basophils Relative: 0 % (ref 0–1)
Eosinophils Absolute: 0.7 10*3/uL (ref 0.0–0.7)
Eosinophils Relative: 7 % — ABNORMAL HIGH (ref 0–5)
HCT: 44.6 % (ref 39.0–52.0)
HEMOGLOBIN: 15.4 g/dL (ref 13.0–17.0)
LYMPHS ABS: 2.5 10*3/uL (ref 0.7–4.0)
Lymphocytes Relative: 24 % (ref 12–46)
MCH: 32.9 pg (ref 26.0–34.0)
MCHC: 34.5 g/dL (ref 30.0–36.0)
MCV: 95.3 fL (ref 78.0–100.0)
MONOS PCT: 9 % (ref 3–12)
Monocytes Absolute: 0.9 10*3/uL (ref 0.1–1.0)
NEUTROS PCT: 60 % (ref 43–77)
Neutro Abs: 6.2 10*3/uL (ref 1.7–7.7)
Platelets: 218 10*3/uL (ref 150–400)
RBC: 4.68 MIL/uL (ref 4.22–5.81)
RDW: 13.9 % (ref 11.5–15.5)
WBC: 10.4 10*3/uL (ref 4.0–10.5)

## 2014-03-04 NOTE — Progress Notes (Signed)
Subjective:    Patient ID: Hunter Howard, male    DOB: [birth year], 80 y.o.   MRN: [id number]  HPI Patient is a pleasant 80 year old white male with a history of left lower lobe lung cancer who on a recent PET scan in 2014 was found to have metastasis to the right lung. He was in his normal state of health up until one week ago. Beginning one week ago he developed severe fatigue, dizziness, stomach cramps with nausea he also developed worsening constipation. He takes oxycodone on a regular basis for chronic pain in his left ribs stemming from his lung cancer.  However he has become constipated even taking daily MiraLax. Patient states he is sleeping 12-18 hours a day. He never feels rested he is always tired. He denies any fevers or chills. He denies any cough. Has a sore throat he denies any dysuria. Chest x-ray shows no evidence of pneumonia. Urinalysis shows no evidence of urinary tract infection. Past Medical History  Diagnosis Date  . Diabetes mellitus   . Hyperlipidemia   . 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 Shortness of breath   . Blood transfusion   . GERD (gastroesophageal reflux disease)   . Coronary artery disease   . CHF (congestive heart failure)   . Dysrhythmia   . Peripheral vascular disease   . Anemia   . Depression   . COPD (chronic obstructive pulmonary disease)     PFT's 04/15/10  FEV1 2.50 (92%) ratio 68 DLC0 78%  . Lung mass 08/21/12    left lower, adenocarcinoma  . 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 Depression   . History of radiation therapy 09/25/12,09/27/12,10/01/12,10/05/12,&10/08/12    LLLlung 50Gy/59fx   Current Outpatient Prescriptions on File Prior to Visit  Medication Sig Dispense Refill  . aspirin 81 MG tablet Take 81 mg by mouth daily.      Marland Kitchen atorvastatin (LIPITOR) 40 MG tablet Take 20 mg by mouth daily.      . brimonidine  (ALPHAGAN) 0.2 % ophthalmic solution 1 drop 3 (three) times daily.      . carvedilol (COREG) 3.125 MG tablet Take 3.125 mg by mouth 2 (two) times daily with a meal.      . cetirizine (ZYRTEC) 10 MG tablet Take 10 mg by mouth daily.        . citalopram (CELEXA) 40 MG tablet Take 40 mg by mouth daily.      . clopidogrel (PLAVIX) 75 MG tablet Take 600 mg today. Then starting 08/18/12 take 1 tablet daily  97 tablet  3  . cyclobenzaprine (FLEXERIL) 10 MG tablet Take 10 mg by mouth as needed.      . ferrous sulfate 325 (65 FE) MG tablet Take 325 mg by mouth daily with breakfast.      . furosemide (LASIX) 20 MG tablet Take 20 mg by mouth daily.      . insulin glargine (LANTUS) 100 UNIT/ML injection Inject 50 Units into the skin daily.       . insulin regular (HUMULIN R) 100 UNIT/ML injection Inject 16 Units into the skin 4 (four) times daily. Sliding scale      . losartan (COZAAR) 100 MG tablet Take 50 mg by mouth daily.      . meclizine (MEDI-MECLIZINE) 25 MG tablet Take 25 mg by mouth  3 (three) times daily as needed. For dizziness.      . metFORMIN (GLUCOPHAGE) 500 MG tablet Take 500 mg by mouth 3 (three) times daily.      . nitroGLYCERIN (NITROSTAT) 0.4 MG SL tablet Place 0.4 mg under the tongue every 5 (five) minutes as needed. For chest pain.      Marland Kitchen oxyCODONE-acetaminophen (ROXICET) 5-325 MG per tablet Take 1 tablet by mouth every 4 (four) hours as needed.  60 tablet  0  . Potassium Chloride Crys CR (KLOR-CON M10 PO) Take 2 tablets by mouth daily.       Marland Kitchen zolpidem (AMBIEN) 10 MG tablet Take 10 mg by mouth at bedtime as needed.       No current facility-administered medications on file prior to visit.   No Known Allergies History   Social History  . Marital Status: Married    Spouse Name: N/A    Number of Children: N/A  . Years of Education: N/A   Occupational History  . retired    Social History Main Topics  . Smoking status: Former Smoker -- 1.00 packs/day for 25 years    Types:  Cigarettes    Quit date: 02/23/1982  . Smokeless tobacco: Never Used  . Alcohol Use: No  . Drug Use: No  . Sexual Activity: No   Other Topics Concern  . Not on file   Social History Narrative  . No narrative on file      Review of Systems  All other systems reviewed and are negative.       Objective:   Physical Exam  Constitutional: He appears well-developed and well-nourished.  Eyes: Conjunctivae are normal. Pupils are equal, round, and reactive to light. No scleral icterus.  Neck: Neck supple. No JVD present. No thyromegaly present.  Cardiovascular: Normal rate, regular rhythm and normal heart sounds.  Exam reveals no gallop and no friction rub.   No murmur heard. Pulmonary/Chest: Effort normal and breath sounds normal. No respiratory distress. He has no wheezes. He has no rales.  Abdominal: Soft. Bowel sounds are normal. He exhibits no distension. There is no tenderness. There is no rebound and no guarding.  Lymphadenopathy:    He has no cervical adenopathy.  Skin: Skin is warm. No rash noted. No erythema. No pallor.          Assessment & Plan:  Other malaise and fatigue - Plan: DG Chest 2 View, CBC with Differential, COMPLETE METABOLIC PANEL WITH GFR, Urinalysis, Routine w reflex microscopic  Patient has no evidence of urinary tract infection or pneumonia. Therefore I do not believe that an infection as a cause of his fatigue. I will also check a CBC and CMP to evaluate for anemia and other electrolyte disturbances. It is possible that his fatigue is stemming from his metastatic lung cancer.  Constipations coming from his chronic type use. Therefore I recommended add linzess 145 poqd to help that.  Also recommended the patient discontinue Ambien since he is sleeping throughout the day as this may contribute to his hypersomnolence.

## 2014-03-05 ENCOUNTER — Telehealth: Payer: Self-pay | Admitting: Oncology

## 2014-03-05 ENCOUNTER — Encounter: Payer: Self-pay | Admitting: Family Medicine

## 2014-03-05 NOTE — Telephone Encounter (Signed)
Talked to pt's wife gave her appt for lab and MD on 3/30 now seeing Dr. Julien Nordmann

## 2014-03-07 ENCOUNTER — Telehealth: Payer: Self-pay | Admitting: Internal Medicine

## 2014-03-07 NOTE — Telephone Encounter (Signed)
Faxed pt medical records to [facility]

## 2014-03-11 ENCOUNTER — Other Ambulatory Visit: Payer: Medicare HMO

## 2014-03-11 ENCOUNTER — Ambulatory Visit: Payer: Medicare HMO | Admitting: Oncology

## 2014-03-24 ENCOUNTER — Other Ambulatory Visit (HOSPITAL_BASED_OUTPATIENT_CLINIC_OR_DEPARTMENT_OTHER): Payer: Medicare Other

## 2014-03-24 ENCOUNTER — Telehealth: Payer: Self-pay | Admitting: Internal Medicine

## 2014-03-24 ENCOUNTER — Encounter: Payer: Self-pay | Admitting: Internal Medicine

## 2014-03-24 ENCOUNTER — Ambulatory Visit (HOSPITAL_BASED_OUTPATIENT_CLINIC_OR_DEPARTMENT_OTHER): Payer: Medicare Other | Admitting: Internal Medicine

## 2014-03-24 VITALS — BP 138/72 | HR 85 | Temp 97.7°F | Resp 20 | Ht 74.0 in | Wt 192.2 lb

## 2014-03-24 DIAGNOSIS — C3430 Malignant neoplasm of lower lobe, unspecified bronchus or lung: Secondary | ICD-10-CM

## 2014-03-24 LAB — CBC WITH DIFFERENTIAL/PLATELET
BASO%: 0.4 % (ref 0.0–2.0)
Basophils Absolute: 0 10*3/uL (ref 0.0–0.1)
EOS%: 4.7 % (ref 0.0–7.0)
Eosinophils Absolute: 0.4 10*3/uL (ref 0.0–0.5)
HEMATOCRIT: 44.4 % (ref 38.4–49.9)
HGB: 14.7 g/dL (ref 13.0–17.1)
LYMPH#: 2.2 10*3/uL (ref 0.9–3.3)
LYMPH%: 24.8 % (ref 14.0–49.0)
MCH: 32.8 pg (ref 27.2–33.4)
MCHC: 33.1 g/dL (ref 32.0–36.0)
MCV: 98.9 fL — ABNORMAL HIGH (ref 79.3–98.0)
MONO#: 0.8 10*3/uL (ref 0.1–0.9)
MONO%: 9.8 % (ref 0.0–14.0)
NEUT#: 5.2 10*3/uL (ref 1.5–6.5)
NEUT%: 60.3 % (ref 39.0–75.0)
Platelets: 212 10*3/uL (ref 140–400)
RBC: 4.49 10*6/uL (ref 4.20–5.82)
RDW: 13.1 % (ref 11.0–14.6)
WBC: 8.7 10*3/uL (ref 4.0–10.3)

## 2014-03-24 LAB — COMPREHENSIVE METABOLIC PANEL (CC13)
ALT: 9 U/L (ref 0–55)
AST: 14 U/L (ref 5–34)
Albumin: 3.9 g/dL (ref 3.5–5.0)
Alkaline Phosphatase: 96 U/L (ref 40–150)
Anion Gap: 11 mEq/L (ref 3–11)
BUN: 15.2 mg/dL (ref 7.0–26.0)
CALCIUM: 10 mg/dL (ref 8.4–10.4)
CHLORIDE: 104 meq/L (ref 98–109)
CO2: 27 mEq/L (ref 22–29)
Creatinine: 0.9 mg/dL (ref 0.7–1.3)
Glucose: 156 mg/dl — ABNORMAL HIGH (ref 70–140)
Potassium: 4.3 mEq/L (ref 3.5–5.1)
SODIUM: 142 meq/L (ref 136–145)
TOTAL PROTEIN: 7.8 g/dL (ref 6.4–8.3)
Total Bilirubin: 0.9 mg/dL (ref 0.20–1.20)

## 2014-03-24 NOTE — Telephone Encounter (Signed)
gv adn printed appt sched and avs for pt for April and May

## 2014-03-24 NOTE — Progress Notes (Signed)
[facility] [phone]   [phone]  OFFICE PROGRESS NOTE  Odette Fraction, MD [address] [place] [address]  DIAGNOSIS:  1) Clinical stage IB adenocarcinoma with negative EGFR mutation and negative ALK gene translocation of the left lung treated with primary radiation at time of diagnosis May 2013. 2) bilateral pulmonary nodules in the right middle lobe and right lower lobe diagnosed in October of 2014.  PRIOR THERAPY: Curative radiotherapy to the left lung well-differentiated adenocarcinoma in May of 2013  CURRENT THERAPY: Observation.  INTERVAL HISTORY: Hunter Howard 80 y.o. male returns to the clinic today for followup visit accompanied by his daughter Hunter Howard. He is a former patient of Dr. Beryle Beams and he is here today for evaluation and to establish care with me. The patient is feeling fine today with no specific complaints except for mild fatigue and occasional left-sided chest pain. He denied having any significant shortness breath, cough or hemoptysis. The patient denied having any significant weight loss or night sweats. He has been observation for recurrent lung cancer in the right lung. His last imaging studies was in May of 2014. He denied having any fever or chills, no nausea or vomiting.  MEDICAL HISTORY: Past Medical History  Diagnosis Date  . Diabetes mellitus   . Hyperlipidemia   . 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 Shortness of breath   . Blood transfusion   . GERD (gastroesophageal reflux disease)   . Coronary artery disease   . CHF (congestive heart failure)   . Dysrhythmia   . Peripheral vascular disease   . Anemia   . Depression   . COPD (chronic obstructive pulmonary disease)     PFT's 04/15/10  FEV1 2.50 (92%) ratio 68 DLC0 78%  . Lung mass 08/21/12    left lower, adenocarcinoma  . 4.1 cm well differentiated adenocarcinoma of the left lower lobe  of the lung (T2a N0 M0 - Stage IB) 05/04/2012  . Depression   . History of radiation therapy 09/25/12,09/27/12,10/01/12,10/05/12,&10/08/12    LLLlung 50Gy/34f    ALLERGIES:  has No Known Allergies.  MEDICATIONS:  Current Outpatient Prescriptions  Medication Sig Dispense Refill  . aspirin 81 MG tablet Take 81 mg by mouth daily.      .Marland Kitchenatorvastatin (LIPITOR) 40 MG tablet Take 20 mg by mouth daily.      . brimonidine (ALPHAGAN) 0.2 % ophthalmic solution 1 drop 3 (three) times daily.      . carvedilol (COREG) 3.125 MG tablet Take 3.125 mg by mouth 2 (two) times daily with a meal.      . cetirizine (ZYRTEC) 10 MG tablet Take 10 mg by mouth daily.        . citalopram (CELEXA) 40 MG tablet Take 40 mg by mouth daily.      . clopidogrel (PLAVIX) 75 MG tablet Take 600 mg today. Then starting 08/18/12 take 1 tablet daily  97 tablet  3  . cyclobenzaprine (FLEXERIL) 10 MG tablet Take 10 mg by mouth as needed.      . ferrous sulfate 325 (65 FE) MG tablet Take 325 mg by mouth daily with breakfast.      . furosemide (LASIX) 20 MG tablet Take 20 mg by mouth daily.      . insulin glargine (LANTUS) 100 UNIT/ML injection Inject 50 Units into the skin daily.       .Marland Kitchen  insulin regular (HUMULIN R) 100 UNIT/ML injection Inject 16 Units into the skin 4 (four) times daily. Sliding scale      . losartan (COZAAR) 100 MG tablet Take 50 mg by mouth daily.      . meclizine (MEDI-MECLIZINE) 25 MG tablet Take 25 mg by mouth 3 (three) times daily as needed. For dizziness.      . metFORMIN (GLUCOPHAGE) 500 MG tablet Take 500 mg by mouth 3 (three) times daily.      . nitroGLYCERIN (NITROSTAT) 0.4 MG SL tablet Place 0.4 mg under the tongue every 5 (five) minutes as needed. For chest pain.      Marland Kitchen oxyCODONE-acetaminophen (ROXICET) 5-325 MG per tablet Take 1 tablet by mouth every 4 (four) hours as needed.  60 tablet  0  . Potassium Chloride Crys CR (KLOR-CON M10 PO) Take 2 tablets by mouth daily.       Marland Kitchen zolpidem (AMBIEN) 10 MG  tablet Take 10 mg by mouth at bedtime as needed.       No current facility-administered medications for this visit.    SURGICAL HISTORY:  Past Surgical History  Procedure Laterality Date  . Cataract extraction    . Colonoscopy  2012  . Enteroscopy  12/16/2011    Procedure: ENTEROSCOPY;  Surgeon: Beryle Beams, MD;  Location: [facility];  Service: Endoscopy;  Laterality: N/A;  . Coronary angioplasty with stent placement    . Mastectomy      Had when 82month old  . Tonsillectomy    . Lung biopsy  08/21/12    LLL    REVIEW OF SYSTEMS:  Constitutional: positive for fatigue Eyes: negative Ears, nose, mouth, throat, and face: negative Respiratory: positive for pleurisy/chest pain Cardiovascular: negative Gastrointestinal: negative Genitourinary:negative Integument/breast: negative Hematologic/lymphatic: negative Musculoskeletal:negative Neurological: negative Behavioral/Psych: negative Endocrine: negative Allergic/Immunologic: negative   PHYSICAL EXAMINATION: General appearance: alert, cooperative, fatigued and no distress Head: Normocephalic, without obvious abnormality, atraumatic Neck: no adenopathy, no JVD, supple, symmetrical, trachea midline and thyroid not enlarged, symmetric, no tenderness/mass/nodules Lymph nodes: Cervical, supraclavicular, and axillary nodes normal. Resp: clear to auscultation bilaterally Back: symmetric, no curvature. ROM normal. No CVA tenderness. Cardio: regular rate and rhythm, S1, S2 normal, no murmur, click, rub or gallop GI: soft, non-tender; bowel sounds normal; no masses,  no organomegaly Extremities: extremities normal, atraumatic, no cyanosis or edema Neurologic: Alert and oriented X 3, normal strength and tone. Normal symmetric reflexes. Normal coordination and gait  ECOG PERFORMANCE STATUS: 1 - Symptomatic but completely ambulatory  Blood pressure 138/72, pulse 85, temperature 97.7 F (36.5 C), temperature source Oral, resp. rate  20, height _0  (1.88 m), weight 192 lb 3.2 oz (87.181 kg), SpO2 94.00%.  LABORATORY DATA: Lab Results  Component Value Date   WBC 8.7 03/24/2014   HGB 14.7 03/24/2014   HCT 44.4 03/24/2014   MCV 98.9* 03/24/2014   PLT 212 03/24/2014      Chemistry      Component Value Date/Time   NA 138 03/04/2014 1126   NA 141 12/06/2013 1049   K 3.9 03/04/2014 1126   K 4.2 12/06/2013 1049   CL 101 03/04/2014 1126   CL 105 04/02/2013 0905   CO2 24 03/04/2014 1126   CO2 26 12/06/2013 1049   BUN 17 03/04/2014 1126   BUN 12.7 12/06/2013 1049   CREATININE 0.77 03/04/2014 1126   CREATININE 0.7 12/06/2013 1049   CREATININE 0.9 02/12/2013 1218      Component Value Date/Time   CALCIUM 9.0  03/04/2014 1126   CALCIUM 10.1 12/06/2013 1049   ALKPHOS 84 03/04/2014 1126   ALKPHOS 91 12/06/2013 1049   AST 16 03/04/2014 1126   AST 18 12/06/2013 1049   ALT 14 03/04/2014 1126   ALT 14 12/06/2013 1049   BILITOT 0.6 03/04/2014 1126   BILITOT 0.82 12/06/2013 1049       RADIOGRAPHIC STUDIES: Dg Chest 2 View  03/04/2014   CLINICAL DATA:  History of lung carcinoma, fatigue  EXAM: CHEST  2 VIEW  COMPARISON:  PET-CT of 10/17/2013, chest x-ray of 12/06/2013, and CT chest of 08/08/2013  FINDINGS: Opacity at the left lung base is unchanged consistent with partial left lower lobe collapse and left effusion when compared to the prior CT. As noted on PET scan, or residual or recurrent tumor cannot be excluded within the left lung base. Mild volume loss is present at the right lung base. Cardiomegaly is stable. Mediastinal contours are stable. No bony abnormality seen other than diffuse degenerative change.  IMPRESSION: Persistent opacity posteriorly at the left lung base consistent with partial collapse of the left lower lobe and left effusion. Cannot exclude recurrent or residual tumor.   Electronically Signed   By: Ivar Drape M.D.   On: 03/04/2014 12:58    ASSESSMENT AND PLAN: This is a very pleasant 80 years old white male with  recurrent non-small cell lung cancer involving the right lung and has been observation with no intervention. His last imaging studies was in October of 2014. I have a lengthy discussion with the patient and his family today about his current disease status and treatment options. I recommended for the patient to have repeat CT scan of the chest for evaluation of his disease. I also discussed with him the option of treatment with chemotherapy with single agent Alimta or oral Tarceva if he is interested in treatment after the CT scan. The patient and his daughter agreed to the current plan. He will continue on OxyContin for pain management and he received his medication from the [place] clinic. He was advised to call immediately if he has any concerning symptoms in the interval.  The patient voices understanding of current disease status and treatment options and is in agreement with the current care plan.  All questions were answered. The patient knows to call the clinic with any problems, questions or concerns. We can certainly see the patient much sooner if necessary.  I spent 20 minutes counseling the patient face to face. The total time spent in the appointment was 30 minutes.  Disclaimer: This note was dictated with voice recognition software. Similar sounding words can inadvertently be transcribed and may not be corrected upon review.

## 2014-04-04 ENCOUNTER — Ambulatory Visit (INDEPENDENT_AMBULATORY_CARE_PROVIDER_SITE_OTHER): Payer: Medicare Other | Admitting: Family Medicine

## 2014-04-04 ENCOUNTER — Encounter: Payer: Self-pay | Admitting: Family Medicine

## 2014-04-04 VITALS — BP 126/64 | HR 72 | Temp 96.8°F | Resp 16 | Ht 74.0 in | Wt 194.0 lb

## 2014-04-04 DIAGNOSIS — E1165 Type 2 diabetes mellitus with hyperglycemia: Secondary | ICD-10-CM

## 2014-04-04 DIAGNOSIS — IMO0001 Reserved for inherently not codable concepts without codable children: Secondary | ICD-10-CM

## 2014-04-04 DIAGNOSIS — R079 Chest pain, unspecified: Secondary | ICD-10-CM

## 2014-04-04 DIAGNOSIS — E785 Hyperlipidemia, unspecified: Secondary | ICD-10-CM

## 2014-04-04 DIAGNOSIS — I10 Essential (primary) hypertension: Secondary | ICD-10-CM

## 2014-04-04 DIAGNOSIS — R0781 Pleurodynia: Secondary | ICD-10-CM

## 2014-04-04 DIAGNOSIS — I2510 Atherosclerotic heart disease of native coronary artery without angina pectoris: Secondary | ICD-10-CM

## 2014-04-04 DIAGNOSIS — Z23 Encounter for immunization: Secondary | ICD-10-CM

## 2014-04-04 NOTE — Progress Notes (Signed)
Subjective:    Patient ID: Hunter Howard, male    DOB: [birth year], 80 y.o.   MRN: [id number]  HPI  Patient has a history of left lower lobe lung cancer. He also has a history of insulin-dependent diabetes mellitus. He is currently on Lantus 50 units subcutaneous daily and 16 units of NovoLog with meals. His fasting blood sugars are averaging 1 20/200. He denies any hypoglycemic episodes. His blood pressures well controlled at 126/64. Unfortunately he continues to have left sided chest pain and pleurisy related to his lung cancer and rib pain. He continues to take narcotic pain medication for that. His oncologist has recommended chemotherapy to try to help reduce the cancer burden and also possibly control his pain better. He is considering that the present time. He is over due for Prevnar 13. Past Medical History  Diagnosis Date  . Diabetes mellitus   . Hyperlipidemia   . 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 Shortness of breath   . Blood transfusion   . GERD (gastroesophageal reflux disease)   . Coronary artery disease   . CHF (congestive heart failure)   . Dysrhythmia   . Peripheral vascular disease   . Anemia   . Depression   . COPD (chronic obstructive pulmonary disease)     PFT's 04/15/10  FEV1 2.50 (92%) ratio 68 DLC0 78%  . Lung mass 08/21/12    left lower, adenocarcinoma  . 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 Depression   . History of radiation therapy 09/25/12,09/27/12,10/01/12,10/05/12,&10/08/12    LLLlung 50Gy/52fx   Current Outpatient Prescriptions on File Prior to Visit  Medication Sig Dispense Refill  . aspirin 81 MG tablet Take 81 mg by mouth daily.      Marland Kitchen atorvastatin (LIPITOR) 40 MG tablet Take 20 mg by mouth daily.      . brimonidine (ALPHAGAN) 0.2 % ophthalmic solution 1 drop 3 (three) times daily.      . carvedilol (COREG) 3.125 MG tablet Take  3.125 mg by mouth 2 (two) times daily with a meal.      . cetirizine (ZYRTEC) 10 MG tablet Take 10 mg by mouth daily.        . citalopram (CELEXA) 40 MG tablet Take 40 mg by mouth daily.      . clopidogrel (PLAVIX) 75 MG tablet Take 600 mg today. Then starting 08/18/12 take 1 tablet daily  97 tablet  3  . cyclobenzaprine (FLEXERIL) 10 MG tablet Take 10 mg by mouth as needed.      . furosemide (LASIX) 20 MG tablet Take 20 mg by mouth daily.      . insulin glargine (LANTUS) 100 UNIT/ML injection Inject 50 Units into the skin daily.       . insulin regular (HUMULIN R) 100 UNIT/ML injection Inject 16 Units into the skin 4 (four) times daily. Sliding scale      . losartan (COZAAR) 100 MG tablet Take 50 mg by mouth daily.      . meclizine (MEDI-MECLIZINE) 25 MG tablet Take 25 mg by mouth 3 (three) times daily as needed. For dizziness.      . metFORMIN (GLUCOPHAGE) 500 MG tablet Take 500 mg by mouth 3 (three) times daily.      . nitroGLYCERIN (NITROSTAT) 0.4 MG SL tablet Place 0.4 mg under the tongue  every 5 (five) minutes as needed. For chest pain.      Marland Kitchen oxyCODONE-acetaminophen (ROXICET) 5-325 MG per tablet Take 1 tablet by mouth every 4 (four) hours as needed.  60 tablet  0  . Potassium Chloride Crys CR (KLOR-CON M10 PO) Take 2 tablets by mouth daily.       Marland Kitchen zolpidem (AMBIEN) 10 MG tablet Take 10 mg by mouth at bedtime as needed.       No current facility-administered medications on file prior to visit.   No Known Allergies History   Social History  . Marital Status: Married    Spouse Name: N/A    Number of Children: N/A  . Years of Education: N/A   Occupational History  . retired    Social History Main Topics  . Smoking status: Former Smoker -- 1.00 packs/day for 25 years    Types: Cigarettes    Quit date: 02/23/1982  . Smokeless tobacco: Never Used  . Alcohol Use: No  . Drug Use: No  . Sexual Activity: No   Other Topics Concern  . Not on file   Social History Narrative  . No  narrative on file       Review of Systems  All other systems reviewed and are negative.      Objective:   Physical Exam  Vitals reviewed. Constitutional: He appears well-developed and well-nourished. No distress.  Neck: Neck supple. No JVD present. No thyromegaly present.  Cardiovascular: Normal rate, regular rhythm, normal heart sounds and intact distal pulses.  Exam reveals no gallop and no friction rub.   No murmur heard. Pulmonary/Chest: Effort normal and breath sounds normal. No respiratory distress. He has no wheezes. He has no rales. He exhibits no tenderness.  Abdominal: Soft. Bowel sounds are normal. He exhibits no distension. There is no tenderness. There is no rebound and no guarding.  Musculoskeletal: He exhibits no edema.  Lymphadenopathy:    He has no cervical adenopathy.  Skin: He is not diaphoretic.          Assessment & Plan:  1. Rib pain on left side I will continue to give the patient narcotic pain medication to help manage the pain related to his left lung cancer. He is planning to proceed with chemotherapy to try to help reduce his cancer burden and better manage his pain. He is scheduled have a PET scan prior to starting chemo.  2. HTN (hypertension) Patient's blood pressure is excellent. No changes in his blood pressure medication at this time.  3. HLD (hyperlipidemia) Right the patient return fasting in 3 months I could check a CMP and fasting lipid panel. His goal LDL is less than 70 given his history of coronary artery disease.  4. CAD (coronary artery disease) Patient currently asymptomatic and denies any angina. He is currently taking aspirin as an antiplatelet agent. I like to recheck his fasting lipid panel to make sure his  LDL is less than 70. I also like to manage his diabetes tightly to help reduce the risk of cardiovascular complication. 5. Type II or unspecified type diabetes mellitus without mention of complication, uncontrolled Increase  Lantus to 60 units subcutaneous daily. Continue NovoLog 16 units subcutaneous with meals. Continued to increase Lantus one unit every day until fasting blood sugars fall below 150. Recheck hemoglobin A1c in 3 months along with CMP and fasting lipid panel.

## 2014-04-07 NOTE — Addendum Note (Signed)
Addended by: Shary Decamp B on: 04/07/2014 08:05 AM   Modules accepted: Orders

## 2014-04-21 ENCOUNTER — Encounter (HOSPITAL_COMMUNITY): Payer: Self-pay

## 2014-04-21 ENCOUNTER — Ambulatory Visit (HOSPITAL_COMMUNITY)
Admission: RE | Admit: 2014-04-21 | Discharge: 2014-04-21 | Disposition: A | Discharge status: Home / Self Care | Payer: Medicare Other | Source: Ambulatory Visit | Attending: Internal Medicine | Admitting: Internal Medicine

## 2014-04-21 ENCOUNTER — Other Ambulatory Visit (HOSPITAL_BASED_OUTPATIENT_CLINIC_OR_DEPARTMENT_OTHER): Payer: Medicare Other

## 2014-04-21 DIAGNOSIS — C3430 Malignant neoplasm of lower lobe, unspecified bronchus or lung: Secondary | ICD-10-CM

## 2014-04-21 DIAGNOSIS — C3490 Malignant neoplasm of unspecified part of unspecified bronchus or lung: Secondary | ICD-10-CM | POA: Insufficient documentation

## 2014-04-21 LAB — COMPREHENSIVE METABOLIC PANEL (CC13)
ALK PHOS: 81 U/L (ref 40–150)
ALT: 10 U/L (ref 0–55)
AST: 15 U/L (ref 5–34)
Albumin: 3.7 g/dL (ref 3.5–5.0)
Anion Gap: 11 mEq/L (ref 3–11)
BUN: 13.9 mg/dL (ref 7.0–26.0)
CO2: 27 mEq/L (ref 22–29)
Calcium: 9.8 mg/dL (ref 8.4–10.4)
Chloride: 108 mEq/L (ref 98–109)
Creatinine: 0.8 mg/dL (ref 0.7–1.3)
GLUCOSE: 151 mg/dL — AB (ref 70–140)
POTASSIUM: 4.5 meq/L (ref 3.5–5.1)
Sodium: 146 mEq/L — ABNORMAL HIGH (ref 136–145)
TOTAL PROTEIN: 7.5 g/dL (ref 6.4–8.3)
Total Bilirubin: 0.74 mg/dL (ref 0.20–1.20)

## 2014-04-21 LAB — CBC WITH DIFFERENTIAL/PLATELET
BASO%: 0.5 % (ref 0.0–2.0)
Basophils Absolute: 0 10*3/uL (ref 0.0–0.1)
EOS ABS: 0.4 10*3/uL (ref 0.0–0.5)
EOS%: 4.4 % (ref 0.0–7.0)
HCT: 44 % (ref 38.4–49.9)
HGB: 14.5 g/dL (ref 13.0–17.1)
LYMPH%: 21.2 % (ref 14.0–49.0)
MCH: 32.8 pg (ref 27.2–33.4)
MCHC: 33 g/dL (ref 32.0–36.0)
MCV: 99.4 fL — AB (ref 79.3–98.0)
MONO#: 0.9 10*3/uL (ref 0.1–0.9)
MONO%: 9.8 % (ref 0.0–14.0)
NEUT%: 64.1 % (ref 39.0–75.0)
NEUTROS ABS: 5.7 10*3/uL (ref 1.5–6.5)
Platelets: 209 10*3/uL (ref 140–400)
RBC: 4.43 10*6/uL (ref 4.20–5.82)
RDW: 13.3 % (ref 11.0–14.6)
WBC: 8.9 10*3/uL (ref 4.0–10.3)
lymph#: 1.9 10*3/uL (ref 0.9–3.3)

## 2014-04-21 MED ORDER — IOHEXOL 300 MG/ML  SOLN
80.0000 mL | Freq: Once | INTRAMUSCULAR | Status: AC | PRN
  Administered 2014-04-21: 80 mL via INTRAVENOUS

## 2014-04-28 ENCOUNTER — Encounter: Payer: Self-pay | Admitting: Internal Medicine

## 2014-04-28 ENCOUNTER — Ambulatory Visit (HOSPITAL_BASED_OUTPATIENT_CLINIC_OR_DEPARTMENT_OTHER): Payer: Medicare Other | Admitting: Internal Medicine

## 2014-04-28 VITALS — BP 131/70 | HR 91 | Temp 97.0°F | Resp 17 | Ht 74.0 in | Wt 192.8 lb

## 2014-04-28 DIAGNOSIS — R52 Pain, unspecified: Secondary | ICD-10-CM

## 2014-04-28 DIAGNOSIS — C3430 Malignant neoplasm of lower lobe, unspecified bronchus or lung: Secondary | ICD-10-CM

## 2014-04-28 NOTE — Progress Notes (Signed)
Hunter Howard [phone]   Fax:([phone]  OFFICE PROGRESS NOTE  Odette Fraction, MD [address] [place] [address]  DIAGNOSIS:  1) Clinical stage IB adenocarcinoma with negative EGFR mutation and negative ALK gene translocation of the left lung treated with primary radiation at time of diagnosis May 2013. 2) bilateral pulmonary nodules in the right middle lobe and right lower lobe diagnosed in October of 2014.  PRIOR THERAPY: Curative radiotherapy to the left lung well-differentiated adenocarcinoma in May of 2013  CURRENT THERAPY: Observation.  INTERVAL HISTORY: Hunter Howard 80 y.o. male returns to the clinic today for followup visit accompanied by his wife and daughter Arbie Cookey. The patient is feeling fine today with no specific complaints except for increasing fatigue and sleepiness. He has occasional left-sided chest pain. He denied having any significant shortness of breath, cough or hemoptysis. The patient denied having any significant weight loss or night sweats. He denied having any fever or chills, no nausea or vomiting. He had a recent CT scan of the chest performed and he is here for evaluation and discussion of his scan results.  MEDICAL HISTORY: Past Medical History  Diagnosis Date  . Diabetes mellitus   . Hyperlipidemia   . 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 Shortness of breath   . Blood transfusion   . GERD (gastroesophageal reflux disease)   . Coronary artery disease   . CHF (congestive heart failure)   . Dysrhythmia   . Peripheral vascular disease   . Anemia   . Depression   . COPD (chronic obstructive pulmonary disease)     PFT's 04/15/10  FEV1 2.50 (92%) ratio 68 DLC0 78%  . Lung mass 08/21/12    left lower, adenocarcinoma  . 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 Depression   . History  of radiation therapy 09/25/12,09/27/12,10/01/12,10/05/12,&10/08/12    LLLlung 50Gy/58f    ALLERGIES:  has No Known Allergies.  MEDICATIONS:  Current Outpatient Prescriptions  Medication Sig Dispense Refill  . aspirin 81 MG tablet Take 81 mg by mouth daily.      .Marland Kitchenatorvastatin (LIPITOR) 40 MG tablet Take 20 mg by mouth daily.      . brimonidine (ALPHAGAN) 0.2 % ophthalmic solution 1 drop 3 (three) times daily.      . carvedilol (COREG) 3.125 MG tablet Take 3.125 mg by mouth 2 (two) times daily with a meal.      . cetirizine (ZYRTEC) 10 MG tablet Take 10 mg by mouth daily.        . citalopram (CELEXA) 40 MG tablet Take 40 mg by mouth daily.      . clopidogrel (PLAVIX) 75 MG tablet Take 600 mg today. Then starting 08/18/12 take 1 tablet daily  97 tablet  3  . cyclobenzaprine (FLEXERIL) 10 MG tablet Take 10 mg by mouth as needed.      . furosemide (LASIX) 20 MG tablet Take 20 mg by mouth daily.      . insulin glargine (LANTUS) 100 UNIT/ML injection Inject 50 Units into the skin daily.       . insulin regular (HUMULIN R) 100 UNIT/ML injection Inject 16 Units into the skin 4 (four) times daily. Sliding scale      . losartan (COZAAR) 100 MG tablet Take 50  mg by mouth daily.      . meclizine (MEDI-MECLIZINE) 25 MG tablet Take 25 mg by mouth 3 (three) times daily as needed. For dizziness.      . metFORMIN (GLUCOPHAGE) 500 MG tablet Take 500 mg by mouth 3 (three) times daily.      . nitroGLYCERIN (NITROSTAT) 0.4 MG SL tablet Place 0.4 mg under the tongue every 5 (five) minutes as needed. For chest pain.      Marland Kitchen oxyCODONE-acetaminophen (ROXICET) 5-325 MG per tablet Take 1 tablet by mouth every 4 (four) hours as needed.  60 tablet  0  . Potassium Chloride Crys CR (KLOR-CON M10 PO) Take 2 tablets by mouth daily.       Marland Kitchen zolpidem (AMBIEN) 10 MG tablet Take 10 mg by mouth at bedtime as needed.       No current facility-administered medications for this visit.    SURGICAL HISTORY:  Past Surgical History    Procedure Laterality Date  . Cataract extraction    . Colonoscopy  2012  . Enteroscopy  12/16/2011    Procedure: ENTEROSCOPY;  Surgeon: Beryle Beams, MD;  Location: [facility];  Service: Endoscopy;  Laterality: N/A;  . Coronary angioplasty with stent placement    . Mastectomy      Had when 64month old  . Tonsillectomy    . Lung biopsy  08/21/12    LLL    REVIEW OF SYSTEMS:  Constitutional: positive for fatigue Eyes: negative Ears, nose, mouth, throat, and face: negative Respiratory: positive for pleurisy/chest pain Cardiovascular: negative Gastrointestinal: negative Genitourinary:negative Integument/breast: negative Hematologic/lymphatic: negative Musculoskeletal:negative Neurological: negative Behavioral/Psych: negative Endocrine: negative Allergic/Immunologic: negative   PHYSICAL EXAMINATION: General appearance: alert, cooperative, fatigued and no distress Head: Normocephalic, without obvious abnormality, atraumatic Neck: no adenopathy, no JVD, supple, symmetrical, trachea midline and thyroid not enlarged, symmetric, no tenderness/mass/nodules Lymph nodes: Cervical, supraclavicular, and axillary nodes normal. Resp: clear to auscultation bilaterally Back: symmetric, no curvature. ROM normal. No CVA tenderness. Cardio: regular rate and rhythm, S1, S2 normal, no murmur, click, rub or gallop GI: soft, non-tender; bowel sounds normal; no masses,  no organomegaly Extremities: extremities normal, atraumatic, no cyanosis or edema Neurologic: Alert and oriented X 3, normal strength and tone. Normal symmetric reflexes. Normal coordination and gait  ECOG PERFORMANCE STATUS: 1 - Symptomatic but completely ambulatory  Blood pressure 131/70, pulse 91, temperature 97 F (36.1 C), temperature source Oral, resp. rate 17, height _0  (1.88 m), weight 192 lb 12.8 oz (87.454 kg), SpO2 97.00%.  LABORATORY DATA: Lab Results  Component Value Date   WBC 8.9 04/21/2014   HGB 14.5  04/21/2014   HCT 44.0 04/21/2014   MCV 99.4* 04/21/2014   PLT 209 04/21/2014      Chemistry      Component Value Date/Time   NA 146* 04/21/2014 1047   NA 138 03/04/2014 1126   K 4.5 04/21/2014 1047   K 3.9 03/04/2014 1126   CL 101 03/04/2014 1126   CL 105 04/02/2013 0905   CO2 27 04/21/2014 1047   CO2 24 03/04/2014 1126   BUN 13.9 04/21/2014 1047   BUN 17 03/04/2014 1126   CREATININE 0.8 04/21/2014 1047   CREATININE 0.77 03/04/2014 1126   CREATININE 0.9 02/12/2013 1218      Component Value Date/Time   CALCIUM 9.8 04/21/2014 1047   CALCIUM 9.0 03/04/2014 1126   ALKPHOS 81 04/21/2014 1047   ALKPHOS 84 03/04/2014 1126   AST 15 04/21/2014 1047   AST 16 03/04/2014  1126   ALT 10 04/21/2014 1047   ALT 14 03/04/2014 1126   BILITOT 0.74 04/21/2014 1047   BILITOT 0.6 03/04/2014 1126       RADIOGRAPHIC STUDIES: Ct Chest W Contrast  04/21/2014   CLINICAL DATA:  Followup lung carcinoma. Status post radiation therapy.  EXAM: CT CHEST WITH CONTRAST  TECHNIQUE: Multidetector CT imaging of the chest was performed during intravenous contrast administration.  CONTRAST:  56m OMNIPAQUE IOHEXOL 300 MG/ML  SOLN  COMPARISON:  08/08/2013  FINDINGS: Small left pleural effusion and atelectasis or scarring in the left lung base appears stable compared to previous study.  Ill-defined masslike opacity in the posterior left lower lobe has increased in size, currently measuring 4.1 x 4.6 cm on image 51 of series 5 compared to 1.6 x 2.3 cm previously. Another spiculated mass in the right middle lobe has also increased in size, currently measuring 2.3 x 2.9 cm on image 45 compared to 1.4 x 2.3 cm previously. Two new pulmonary nodules are noted in the superior right lower lobe measuring 5 mm and 8 mm on images 33 and 35 respectively.  Small less than 1 cm mediastinal lymph nodes are stable. Tiny pericardial effusion versus pericardial thickening also unchanged. Mild cardiomegaly stable. No evidence of axillary lymphadenopathy or chest  wall mass. Both adrenal glands are normal in appearance. No suspicious bone lesions identified. Incompletely healed left rib fracture deformities again noted.  IMPRESSION: Increased size of pulmonary masses and nodules in right middle and lower lobes, consistent with tumor progression.  No significant change in small left pleural effusion and left lower lung atelectasis or scarring.   Electronically Signed   By: JEarle GellM.D.   On: 04/21/2014 14:01    ASSESSMENT AND PLAN: This is a very pleasant 80years old white male with recurrent non-small cell lung cancer involving the right lung and has been observation with no intervention. His recent scan showed significant evidence for disease progression especially in the right lung was incompletely healed left rib fracture deformities that may explain his left sided chest pain. I discussed the scan results and showed the images to the patient and his family. I gave the patient the option of palliative care and hospice referral versus consideration of systemic chemotherapy with single agent Alimta versus treatment with oral Tarceva. The patient is not interested in any treatment and he would like to consider palliative care option. They contact his primary care physician who had been following him for years for the hospice referral. For pain management, he will continue on Percocet an as-needed basis. He was advised to call immediately if he has any concerning symptoms in the interval.  The patient voices understanding of current disease status and treatment options and is in agreement with the current care plan.  All questions were answered. The patient knows to call the clinic with any problems, questions or concerns. We can certainly see the patient much sooner if necessary.  Disclaimer: This note was dictated with voice recognition software. Similar sounding words can inadvertently be transcribed and may not be corrected upon review.

## 2014-05-01 ENCOUNTER — Ambulatory Visit (INDEPENDENT_AMBULATORY_CARE_PROVIDER_SITE_OTHER): Payer: Medicare Other | Admitting: Family Medicine

## 2014-05-01 ENCOUNTER — Encounter: Payer: Self-pay | Admitting: Family Medicine

## 2014-05-01 VITALS — BP 128/64 | HR 81 | Temp 98.2°F | Resp 16 | Ht 71.26 in | Wt 197.0 lb

## 2014-05-01 DIAGNOSIS — C3490 Malignant neoplasm of unspecified part of unspecified bronchus or lung: Secondary | ICD-10-CM

## 2014-05-01 DIAGNOSIS — C801 Malignant (primary) neoplasm, unspecified: Secondary | ICD-10-CM

## 2014-05-01 NOTE — Progress Notes (Signed)
Subjective:    Patient ID: Hunter Howard, male    DOB: 1927-06-01, 80 y.o.   MRN: [id number]  HPI  Patient has a history of adenocarcinoma in the left lower lung. On recent CT scan, metastasis were found in his right lung. The patient has stage IV lung cancer. After a long discussion with his oncologist he elected not to pursue chemotherapy. His oncologist has therefore been recommended hospice. He is here today to discuss further. Past Medical History  Diagnosis Date  . Diabetes mellitus   . Hyperlipidemia   . 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 Shortness of breath   . Blood transfusion   . GERD (gastroesophageal reflux disease)   . Coronary artery disease   . CHF (congestive heart failure)   . Dysrhythmia   . Peripheral vascular disease   . Anemia   . Depression   . COPD (chronic obstructive pulmonary disease)     PFT's 04/15/10  FEV1 2.50 (92%) ratio 68 DLC0 78%  . Lung mass 08/21/12    left lower, adenocarcinoma  . 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 Depression   . History of radiation therapy 09/25/12,09/27/12,10/01/12,10/05/12,&10/08/12    LLLlung 50Gy/24fx   Current Outpatient Prescriptions on File Prior to Visit  Medication Sig Dispense Refill  . aspirin 81 MG tablet Take 81 mg by mouth daily.      Marland Kitchen atorvastatin (LIPITOR) 40 MG tablet Take 20 mg by mouth daily.      . brimonidine (ALPHAGAN) 0.2 % ophthalmic solution 1 drop 3 (three) times daily.      . carvedilol (COREG) 3.125 MG tablet Take 3.125 mg by mouth 2 (two) times daily with a meal.      . cetirizine (ZYRTEC) 10 MG tablet Take 10 mg by mouth daily.        . citalopram (CELEXA) 40 MG tablet Take 40 mg by mouth daily.      . clopidogrel (PLAVIX) 75 MG tablet Take 600 mg today. Then starting 08/18/12 take 1 tablet daily  97 tablet  3  . cyclobenzaprine (FLEXERIL) 10 MG tablet Take 10 mg by  mouth as needed.      . furosemide (LASIX) 20 MG tablet Take 20 mg by mouth daily.      . insulin glargine (LANTUS) 100 UNIT/ML injection Inject 50 Units into the skin daily.       . insulin regular (HUMULIN R) 100 UNIT/ML injection Inject 16 Units into the skin 4 (four) times daily. Sliding scale      . losartan (COZAAR) 100 MG tablet Take 50 mg by mouth daily.      . meclizine (MEDI-MECLIZINE) 25 MG tablet Take 25 mg by mouth 3 (three) times daily as needed. For dizziness.      . metFORMIN (GLUCOPHAGE) 500 MG tablet Take 500 mg by mouth 3 (three) times daily.      . nitroGLYCERIN (NITROSTAT) 0.4 MG SL tablet Place 0.4 mg under the tongue every 5 (five) minutes as needed. For chest pain.      Marland Kitchen oxyCODONE-acetaminophen (ROXICET) 5-325 MG per tablet Take 1 tablet by mouth every 4 (four) hours as needed.  60 tablet  0  . Potassium Chloride Crys CR (KLOR-CON M10 PO) Take 2 tablets by mouth daily.       Marland Kitchen zolpidem (AMBIEN)  10 MG tablet Take 10 mg by mouth at bedtime as needed.       No current facility-administered medications on file prior to visit.   No Known Allergies History   Social History  . Marital Status: Married    Spouse Name: N/A    Number of Children: N/A  . Years of Education: N/A   Occupational History  . retired    Social History Main Topics  . Smoking status: Former Smoker -- 1.00 packs/day for 25 years    Types: Cigarettes    Quit date: 02/23/1982  . Smokeless tobacco: Never Used  . Alcohol Use: No  . Drug Use: No  . Sexual Activity: No   Other Topics Concern  . Not on file   Social History Narrative  . No narrative on file     Review of Systems  All other systems reviewed and are negative.      Objective:   Physical Exam  Vitals reviewed.         Assessment & Plan:  1. Metastatic lung cancer (metastasis from lung to other site) No exam was performed today. I did spend 20 minutes in discussion with the patient over his decision. I agree that  given his age and complicated medical history that hospice is a sound medical decision. Arrange a hospice consultation. I also decreased the patient's medication list the only medication that provide quality of life. Therefore I recommended the patient discontinue regular insulin but continued Lantus. I recommended he discontinue metformin, losartan, Lipitor, aspirin, Plavix, Zyrtec. Continue carvedilol to tachycardia. I would continue furosemide to pulmonary edema. - Ambulatory referral to [place]

## 2014-06-13 IMAGING — CT CT BIOPSY
1 series · 1 of 31 positions shown · non-contrast
Comparison: none

CLINICAL DATA: Left lower lobe persistent mass / consolidation
which is PET positive, concerning for malignancy

[Series 5: localizer · axial · 5.0mm · 0.65mm/px · 1 of 31 slices shown]
[im 16/31]
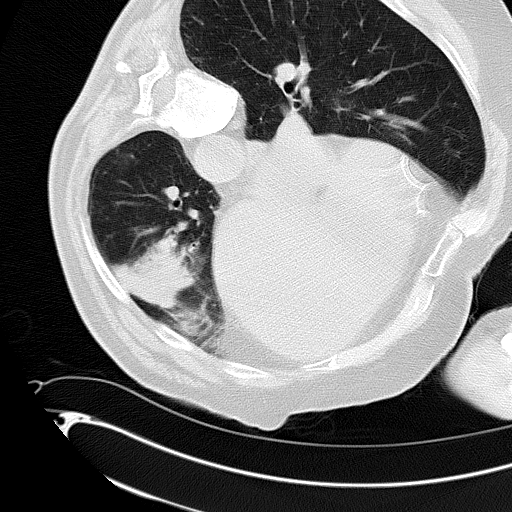

[1 of 31 positions shown; findings below may reference images not displayed]

CT GUIDED LEFT LOWER LOBE MASS 18 GAUGE CORE BIOPSY

Date:  08/21/2012 [DATE]

Radiologist:  Luc-Kendy Minviel, M.D.

Medications:  2 mg Versed, 100 mcg Fentanyl

Guidance:  CT

Fluoroscopy time:  None.

Sedation time:  15 minutes

Contrast volume:  None.

Complications:  No immediate

PROCEDURE/FINDINGS:

Informed consent was obtained from the patient following
explanation of the procedure, risks, benefits and alternatives.
The patient understands, agrees and consents for the procedure.
All questions were addressed.  A time out was performed.

Maximal barrier sterile technique utilized including caps, mask,
sterile gowns, sterile gloves, large sterile drape, hand hygiene,
and betadine

Previous imaging reviewed. The patient was positioned left side
down decubitus.  Noncontrast localization CT performed.  The
inferior left lower lobe mass was localized.  Under sterile
conditions and local anesthesia, a 17 gauge 6.8 cm access needle
was advanced from posterior intercostal approach into the lesion.
Needle position confirmed with CT.  [DATE] gauge core biopsies
obtained and placed in formalin.  Samples were intact non
fragmented.  Needle removed.  Post procedure imaging demonstrates
no evidence of hemorrhage or hematoma.  The patient tolerated the
biopsy well.
IMPRESSION: Successful CT guided left lower lobe mass 18 gauge core biopsies

## 2014-06-16 ENCOUNTER — Emergency Department (HOSPITAL_COMMUNITY): Payer: Medicare Other

## 2014-06-16 ENCOUNTER — Inpatient Hospital Stay (HOSPITAL_COMMUNITY)
Admission: EM | Admit: 2014-06-16 | Discharge: 2014-06-17 | DRG: 291 | Disposition: A | Discharge status: Hospice | Payer: Medicare Other | Attending: Internal Medicine | Admitting: Internal Medicine

## 2014-06-16 ENCOUNTER — Encounter (HOSPITAL_COMMUNITY): Payer: Self-pay | Admitting: Emergency Medicine

## 2014-06-16 DIAGNOSIS — J449 Chronic obstructive pulmonary disease, unspecified: Secondary | ICD-10-CM | POA: Diagnosis present

## 2014-06-16 DIAGNOSIS — E119 Type 2 diabetes mellitus without complications: Secondary | ICD-10-CM | POA: Diagnosis present

## 2014-06-16 DIAGNOSIS — Z800 Family history of malignant neoplasm of digestive organs: Secondary | ICD-10-CM | POA: Diagnosis not present

## 2014-06-16 DIAGNOSIS — Z79899 Other long term (current) drug therapy: Secondary | ICD-10-CM

## 2014-06-16 DIAGNOSIS — K219 Gastro-esophageal reflux disease without esophagitis: Secondary | ICD-10-CM

## 2014-06-16 DIAGNOSIS — I5023 Acute on chronic systolic (congestive) heart failure: Principal | ICD-10-CM | POA: Diagnosis present

## 2014-06-16 DIAGNOSIS — I447 Left bundle-branch block, unspecified: Secondary | ICD-10-CM

## 2014-06-16 DIAGNOSIS — Z8042 Family history of malignant neoplasm of prostate: Secondary | ICD-10-CM | POA: Diagnosis not present

## 2014-06-16 DIAGNOSIS — I2510 Atherosclerotic heart disease of native coronary artery without angina pectoris: Secondary | ICD-10-CM | POA: Diagnosis present

## 2014-06-16 DIAGNOSIS — J189 Pneumonia, unspecified organism: Secondary | ICD-10-CM | POA: Diagnosis present

## 2014-06-16 DIAGNOSIS — Z801 Family history of malignant neoplasm of trachea, bronchus and lung: Secondary | ICD-10-CM

## 2014-06-16 DIAGNOSIS — Z515 Encounter for palliative care: Secondary | ICD-10-CM | POA: Diagnosis not present

## 2014-06-16 DIAGNOSIS — Z923 Personal history of irradiation: Secondary | ICD-10-CM | POA: Diagnosis not present

## 2014-06-16 DIAGNOSIS — I10 Essential (primary) hypertension: Secondary | ICD-10-CM | POA: Diagnosis present

## 2014-06-16 DIAGNOSIS — E089 Diabetes mellitus due to underlying condition without complications: Secondary | ICD-10-CM

## 2014-06-16 DIAGNOSIS — E785 Hyperlipidemia, unspecified: Secondary | ICD-10-CM | POA: Diagnosis present

## 2014-06-16 DIAGNOSIS — J4489 Other specified chronic obstructive pulmonary disease: Secondary | ICD-10-CM | POA: Diagnosis present

## 2014-06-16 DIAGNOSIS — I509 Heart failure, unspecified: Secondary | ICD-10-CM | POA: Diagnosis present

## 2014-06-16 DIAGNOSIS — Z794 Long term (current) use of insulin: Secondary | ICD-10-CM

## 2014-06-16 DIAGNOSIS — R0602 Shortness of breath: Secondary | ICD-10-CM | POA: Diagnosis present

## 2014-06-16 DIAGNOSIS — Z9981 Dependence on supplemental oxygen: Secondary | ICD-10-CM | POA: Diagnosis not present

## 2014-06-16 DIAGNOSIS — J9620 Acute and chronic respiratory failure, unspecified whether with hypoxia or hypercapnia: Secondary | ICD-10-CM | POA: Diagnosis present

## 2014-06-16 DIAGNOSIS — J9621 Acute and chronic respiratory failure with hypoxia: Secondary | ICD-10-CM

## 2014-06-16 DIAGNOSIS — C3430 Malignant neoplasm of lower lobe, unspecified bronchus or lung: Secondary | ICD-10-CM | POA: Diagnosis present

## 2014-06-16 DIAGNOSIS — Z8249 Family history of ischemic heart disease and other diseases of the circulatory system: Secondary | ICD-10-CM | POA: Diagnosis not present

## 2014-06-16 DIAGNOSIS — Z9861 Coronary angioplasty status: Secondary | ICD-10-CM | POA: Diagnosis not present

## 2014-06-16 DIAGNOSIS — Z66 Do not resuscitate: Secondary | ICD-10-CM | POA: Diagnosis present

## 2014-06-16 DIAGNOSIS — Z87891 Personal history of nicotine dependence: Secondary | ICD-10-CM | POA: Diagnosis not present

## 2014-06-16 DIAGNOSIS — R0600 Dyspnea, unspecified: Secondary | ICD-10-CM | POA: Diagnosis present

## 2014-06-16 DIAGNOSIS — E139 Other specified diabetes mellitus without complications: Secondary | ICD-10-CM

## 2014-06-16 HISTORY — DX: Dependence on supplemental oxygen: Z99.81

## 2014-06-16 HISTORY — DX: Unspecified osteoarthritis, unspecified site: M19.90

## 2014-06-16 HISTORY — DX: Pneumonia, unspecified organism: J18.9

## 2014-06-16 HISTORY — DX: Type 2 diabetes mellitus without complications: E11.9

## 2014-06-16 HISTORY — DX: Acute myocardial infarction, unspecified: I21.9

## 2014-06-16 HISTORY — DX: Anxiety disorder, unspecified: F41.9

## 2014-06-16 LAB — TROPONIN I: Troponin I: <0.3 ng/mL (ref <0.30)

## 2014-06-16 LAB — GLUCOSE, CAPILLARY
Glucose-Capillary: 339 mg/dL — ABNORMAL HIGH (ref 70–99)
Glucose-Capillary: 379 mg/dL — ABNORMAL HIGH (ref 70–99)
Glucose-Capillary: 308 mg/dL — ABNORMAL HIGH (ref 70–99)

## 2014-06-16 LAB — I-STAT CHEM 8, ED
BUN: 13 mg/dL (ref 6–23)
CHLORIDE: 103 meq/L (ref 96–112)
Calcium, Ion: 1.19 mmol/L (ref 1.13–1.30)
Creatinine, Ser: 0.8 mg/dL (ref 0.50–1.35)
GLUCOSE: 385 mg/dL — AB (ref 70–99)
HEMATOCRIT: 52 % (ref 39.0–52.0)
Hemoglobin: 17.7 g/dL — ABNORMAL HIGH (ref 13.0–17.0)
Potassium: 4 mEq/L (ref 3.7–5.3)
Sodium: 140 mEq/L (ref 137–147)
TCO2: 20 mmol/L (ref 0–100)

## 2014-06-16 LAB — CBC
HCT: 46.6 % (ref 39.0–52.0)
Hemoglobin: 15.4 g/dL (ref 13.0–17.0)
MCH: 32.5 pg (ref 26.0–34.0)
MCHC: 33 g/dL (ref 30.0–36.0)
MCV: 98.3 fL (ref 78.0–100.0)
Platelets: 192 10*3/uL (ref 150–400)
RBC: 4.74 MIL/uL (ref 4.22–5.81)
RDW: 13.1 % (ref 11.5–15.5)
WBC: 11.2 10*3/uL — ABNORMAL HIGH (ref 4.0–10.5)

## 2014-06-16 LAB — I-STAT TROPONIN, ED: Troponin i, poc: 0.04 ng/mL (ref 0.00–0.08)

## 2014-06-16 LAB — STREP PNEUMONIAE URINARY ANTIGEN: Strep Pneumo Urinary Antigen: NEGATIVE

## 2014-06-16 LAB — PRO B NATRIURETIC PEPTIDE: Pro B Natriuretic peptide (BNP): 991.2 pg/mL — ABNORMAL HIGH (ref 0–450)

## 2014-06-16 LAB — HIV ANTIBODY (ROUTINE TESTING W REFLEX): HIV 1&2 Ab, 4th Generation: NONREACTIVE

## 2014-06-16 MED ORDER — NITROGLYCERIN 0.4 MG SL SUBL: 0.4000 mg | SUBLINGUAL_TABLET | SUBLINGUAL | Status: DC | PRN

## 2014-06-16 MED ORDER — POTASSIUM CHLORIDE CRYS ER 20 MEQ PO TBCR
20.0000 meq | EXTENDED_RELEASE_TABLET | Freq: Every day | ORAL | Status: DC
  Administered 2014-06-16 – 2014-06-17 (×2): 20 meq via ORAL
  Filled 2014-06-16 (×2): qty 1

## 2014-06-16 MED ORDER — SODIUM CHLORIDE 0.9 % IJ SOLN
3.0000 mL | Freq: Two times a day (BID) | INTRAMUSCULAR | Status: DC
  Administered 2014-06-16 – 2014-06-17 (×2): 3 mL via INTRAVENOUS

## 2014-06-16 MED ORDER — INSULIN ASPART 100 UNIT/ML ~~LOC~~ SOLN: 0.0000 [IU] | Freq: Four times a day (QID) | SUBCUTANEOUS | Status: DC

## 2014-06-16 MED ORDER — MORPHINE SULFATE 2 MG/ML IJ SOLN: 2.0000 mg | INTRAMUSCULAR | Status: DC | PRN

## 2014-06-16 MED ORDER — IPRATROPIUM-ALBUTEROL 0.5-2.5 (3) MG/3ML IN SOLN
3.0000 mL | RESPIRATORY_TRACT | Status: DC
  Administered 2014-06-16 (×2): 3 mL via RESPIRATORY_TRACT
  Filled 2014-06-16 (×3): qty 3

## 2014-06-16 MED ORDER — BRIMONIDINE TARTRATE 0.2 % OP SOLN
1.0000 [drp] | Freq: Three times a day (TID) | OPHTHALMIC | Status: DC
  Administered 2014-06-16 – 2014-06-17 (×3): 1 [drp] via OPHTHALMIC
  Filled 2014-06-16: qty 5

## 2014-06-16 MED ORDER — ENOXAPARIN SODIUM 40 MG/0.4ML ~~LOC~~ SOLN
40.0000 mg | SUBCUTANEOUS | Status: DC
  Administered 2014-06-16: 40 mg via SUBCUTANEOUS
  Filled 2014-06-16 (×2): qty 0.4

## 2014-06-16 MED ORDER — IPRATROPIUM-ALBUTEROL 0.5-2.5 (3) MG/3ML IN SOLN
3.0000 mL | Freq: Three times a day (TID) | RESPIRATORY_TRACT | Status: DC
  Administered 2014-06-17: 3 mL via RESPIRATORY_TRACT
  Filled 2014-06-16: qty 3

## 2014-06-16 MED ORDER — BISACODYL 5 MG PO TBEC: 5.0000 mg | DELAYED_RELEASE_TABLET | Freq: Every day | ORAL | Status: DC | PRN

## 2014-06-16 MED ORDER — INSULIN ASPART 100 UNIT/ML ~~LOC~~ SOLN
0.0000 [IU] | Freq: Three times a day (TID) | SUBCUTANEOUS | Status: DC
  Administered 2014-06-16: 15 [IU] via SUBCUTANEOUS
  Administered 2014-06-17: 8 [IU] via SUBCUTANEOUS

## 2014-06-16 MED ORDER — NITROGLYCERIN 2 % TD OINT
1.0000 [in_us] | TOPICAL_OINTMENT | Freq: Once | TRANSDERMAL | Status: AC
  Administered 2014-06-16: 1 [in_us] via TOPICAL
  Filled 2014-06-16: qty 1

## 2014-06-16 MED ORDER — FUROSEMIDE 10 MG/ML IJ SOLN
40.0000 mg | Freq: Every day | INTRAMUSCULAR | Status: DC
  Administered 2014-06-16 – 2014-06-17 (×2): 40 mg via INTRAVENOUS
  Filled 2014-06-16: qty 4

## 2014-06-16 MED ORDER — ONDANSETRON HCL 4 MG/2ML IJ SOLN: 4.0000 mg | Freq: Four times a day (QID) | INTRAMUSCULAR | Status: DC | PRN

## 2014-06-16 MED ORDER — INSULIN ASPART 100 UNIT/ML ~~LOC~~ SOLN
0.0000 [IU] | Freq: Every day | SUBCUTANEOUS | Status: DC
  Administered 2014-06-16: 4 [IU] via SUBCUTANEOUS

## 2014-06-16 MED ORDER — FUROSEMIDE 10 MG/ML IJ SOLN
40.0000 mg | Freq: Once | INTRAMUSCULAR | Status: AC
  Administered 2014-06-16: 40 mg via INTRAVENOUS

## 2014-06-16 MED ORDER — ONDANSETRON HCL 4 MG PO TABS: 4.0000 mg | ORAL_TABLET | Freq: Four times a day (QID) | ORAL | Status: DC | PRN

## 2014-06-16 MED ORDER — IOHEXOL 350 MG/ML SOLN
100.0000 mL | Freq: Once | INTRAVENOUS | Status: AC | PRN
  Administered 2014-06-16: 100 mL via INTRAVENOUS

## 2014-06-16 MED ORDER — LEVOFLOXACIN IN D5W 750 MG/150ML IV SOLN
750.0000 mg | INTRAVENOUS | Status: DC
  Administered 2014-06-16 – 2014-06-17 (×2): 750 mg via INTRAVENOUS
  Filled 2014-06-16 (×2): qty 150

## 2014-06-16 MED ORDER — MORPHINE SULFATE 15 MG PO TABS
15.0000 mg | ORAL_TABLET | ORAL | Status: DC | PRN
Start: 2014-06-16 — End: 2014-06-17
  Administered 2014-06-16 – 2014-06-17 (×3): 15 mg via ORAL
  Filled 2014-06-16 (×3): qty 1

## 2014-06-16 MED ORDER — FUROSEMIDE 10 MG/ML IJ SOLN
40.0000 mg | Freq: Once | INTRAMUSCULAR | Status: AC
  Administered 2014-06-16: 40 mg via INTRAVENOUS
  Filled 2014-06-16: qty 4

## 2014-06-16 MED ORDER — POLYETHYLENE GLYCOL 3350 17 G PO PACK
17.0000 g | PACK | Freq: Every day | ORAL | Status: DC
  Administered 2014-06-16 – 2014-06-17 (×2): 17 g via ORAL
  Filled 2014-06-16 (×2): qty 1

## 2014-06-16 MED ORDER — CARVEDILOL 3.125 MG PO TABS
3.1250 mg | ORAL_TABLET | Freq: Two times a day (BID) | ORAL | Status: DC
  Administered 2014-06-16 – 2014-06-17 (×2): 3.125 mg via ORAL
  Filled 2014-06-16 (×4): qty 1

## 2014-06-16 MED ORDER — ASPIRIN EC 325 MG PO TBEC
325.0000 mg | DELAYED_RELEASE_TABLET | Freq: Every day | ORAL | Status: DC
  Administered 2014-06-16 – 2014-06-17 (×2): 325 mg via ORAL
  Filled 2014-06-16 (×2): qty 1

## 2014-06-16 MED ORDER — INSULIN GLARGINE 100 UNIT/ML ~~LOC~~ SOLN
50.0000 [IU] | Freq: Every day | SUBCUTANEOUS | Status: DC
  Administered 2014-06-16 – 2014-06-17 (×2): 50 [IU] via SUBCUTANEOUS
  Filled 2014-06-16 (×2): qty 0.5

## 2014-06-16 MED ORDER — ASPIRIN 81 MG PO CHEW
324.0000 mg | CHEWABLE_TABLET | Freq: Once | ORAL | Status: AC
  Administered 2014-06-16: 324 mg via ORAL
  Filled 2014-06-16: qty 4

## 2014-06-16 MED ORDER — MORPHINE SULFATE ER 15 MG PO TBCR
15.0000 mg | EXTENDED_RELEASE_TABLET | Freq: Two times a day (BID) | ORAL | Status: DC
  Administered 2014-06-16 – 2014-06-17 (×3): 15 mg via ORAL
  Filled 2014-06-16 (×3): qty 1

## 2014-06-16 MED ORDER — MORPHINE SULFATE 2 MG/ML IJ SOLN
2.0000 mg | INTRAMUSCULAR | Status: DC | PRN
  Administered 2014-06-16: 2 mg via INTRAVENOUS
  Filled 2014-06-16: qty 1

## 2014-06-16 NOTE — ED Notes (Signed)
Family at bedside. 

## 2014-06-16 NOTE — ED Notes (Signed)
Ultrasound of heart by dr Reather Converse at bedside.

## 2014-06-16 NOTE — ED Notes (Signed)
Admitting MD at bedside.

## 2014-06-16 NOTE — ED Notes (Signed)
MD at bedside.Dr Reather Converse

## 2014-06-16 NOTE — ED Notes (Signed)
Pt taken off Bi-pap and placed on nasal cannula at 4L. Tolerating well, O2 95%.

## 2014-06-16 NOTE — Progress Notes (Signed)
Patient is a new admit from the ED. He was transferred by stretcher with IV pump and oxygen accompanied by ED nurse around 1300. Pt.is A/Ox4 and is ambulatory with 1 person assist. He is on 3 L [place] of oxygen. He is hearing impaired.

## 2014-06-16 NOTE — ED Notes (Signed)
Patient here for SOB, woke this am with SOB called ems, pt took 2 ntg prior to ems and ems gave 3 ntg, also was given nebulizer with ems, pt has improved with EMS, initially was rales bilaterally and reported cp now resolved.

## 2014-06-16 NOTE — ED Notes (Signed)
Given call bell for notification if has to urinate,

## 2014-06-16 NOTE — ED Notes (Signed)
Xray complete

## 2014-06-16 NOTE — ED Notes (Signed)
Placed back on bipap by RT

## 2014-06-16 NOTE — ED Notes (Signed)
Pt taken off cpap momentarily for assessment by dr Reather Converse.

## 2014-06-16 NOTE — H&P (Addendum)
[facility] History and Physical  Hunter Howard BSJ:[id number] DOB: Apr 21, 1927 DOA: 06/16/2014  Referring physician:  Elnora Morrison PCP:  Odette Fraction, MD   Chief Complaint:  SOB  HPI:  The patient is a 80 y.o. year-old male with history of recurrent non-small cell lung cancer of the right lung with progression, currently on hospice care, COPD with chronic respiratory failure on 2 L home oxygen, chronic systolic heart failure with ejection fraction of 25-30%, CAD status post stent, hypertension, hyperlipidemia, diabetes mellitus who presents with acute onset shortness of breath.  The patient was last at their baseline health over the last week.  He was set up with home oxygen about one week ago and his pain medication was changed to morphine. Since that time, he has been feeling considerably better. He went out with his family for Father's Day dinner last night, and was feeling very well. He has not had recent fevers, chills, increased cough, shortness of breath with exertion, orthopnea, increased swelling of his legs. He slept well for the initial part of the night, but then awoke suddenly this morning short of breath.  His family states that he acted like he had a difficult time breathing, however they did not notice any boles, wet sounds, wheezes. They called EMS who immediately put him on BiPAP. His oxygen saturations were around 80% and he had severe difficulty breathing. He was transported to the emergency department.  In the emergency department, his labs are notable for white blood cell count of 11.2, hemoglobin 15.4, platelets 192. His troponin was negative. His BMP was notable for a glucose of 385. Chest x-ray demonstrated nodular opacities in the lung bases and left upper long consistent with progression of metastatic disease.  He had cardiac enlargement without vascular congestion but he did have a probable left pleural effusion.  He received Lasix 40 mg IV once, aspirin,  nitroglycerin 1 inch. He is hospice care, DO NOT RESUSCITATE/DO NOT INTUBATE, however he is tolerating his BiPAP well. If he has a reversible condition, he is willing to continue BiPAP until the condition can be reversed. He would like to return home at discharge with home hospice.  Review of Systems:  General:  Denies fevers, chills, weight loss or gain HEENT:  Denies changes to hearing and vision, rhinorrhea, sinus congestion, sore throat CV:  Positive chest tightness which was associated with his acute onset shortness of breath. Denies palpitations, states his lower extremity edema is stable. Weight is stable. PULM:  Per history of present illness   GI:  Denies nausea, vomiting, constipation, diarrhea.   GU:  Denies dysuria, frequency, urgency ENDO:  Denies polyuria, polydipsia.   HEME:  Denies hematemesis, blood in stools, melena, abnormal bruising or bleeding.  LYMPH:  Denies lymphadenopathy.   MSK:  Chronic arthralgias, myalgias.   DERM:  Denies skin rash or ulcer.   NEURO:  Denies focal numbness, weakness, slurred speech, confusion, facial droop.  PSYCH:  Denies anxiety and depression.    Past Medical History  Diagnosis Date  . Diabetes mellitus   . Hyperlipidemia   . 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 Shortness of breath   . Blood transfusion   . GERD (gastroesophageal reflux disease)   . Coronary artery disease   . CHF (congestive heart failure)   . Dysrhythmia   . Peripheral vascular disease   . Anemia   . Depression   . COPD (  chronic obstructive pulmonary disease)     PFT's 04/15/10  FEV1 2.50 (92%) ratio 68 DLC0 78%  . Lung mass 08/21/12    left lower, adenocarcinoma  . 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 Depression   . History of radiation therapy 09/25/12,09/27/12,10/01/12,10/05/12,&10/08/12    LLLlung 50Gy/71fx   Past Surgical History   Procedure Laterality Date  . Cataract extraction    . Colonoscopy  2012  . Enteroscopy  12/16/2011    Procedure: ENTEROSCOPY;  Surgeon: Beryle Beams, MD;  Location: [facility];  Service: Endoscopy;  Laterality: N/A;  . Coronary angioplasty with stent placement    . Mastectomy      Had when 48months old  . Tonsillectomy    . Lung biopsy  08/21/12    LLL   Social History:  reports that he quit smoking about 32 years ago. His smoking use included Cigarettes. He has a 25 pack-year smoking history. He has never used smokeless tobacco. He reports that he does not drink alcohol or use illicit drugs. Lives at home with his wife, his daughter was staying with them.  No Known Allergies  Family History  Problem Relation Age of Onset  . Colon cancer Brother     lung cancer - NOT a smoker  . Prostate cancer Brother   . Lung cancer Mother   . Hypertension Mother      Prior to Admission medications   Medication Sig Start Date End Date Taking? Authorizing Salle Brandle  albuterol (PROVENTIL) (2.5 MG/3ML) 0.083% nebulizer solution Take 2.5 mg by nebulization every 6 (six) hours as needed for wheezing or shortness of breath.   Yes Historical Alecea Trego, MD  brimonidine (ALPHAGAN) 0.2 % ophthalmic solution Place 1 drop into both eyes 3 (three) times daily.    Yes Historical Shanekia Latella, MD  carvedilol (COREG) 3.125 MG tablet Take 3.125 mg by mouth 2 (two) times daily with a meal. 10/03/11  Yes Josue Hector, MD  ferrous sulfate 325 (65 FE) MG tablet Take 325 mg by mouth daily with breakfast.   Yes Historical Samanthajo Payano, MD  furosemide (LASIX) 20 MG tablet Take 20 mg by mouth daily.   Yes Historical Ivee Poellnitz, MD  insulin glargine (LANTUS) 100 UNIT/ML injection Inject 70 Units into the skin daily.   Yes Historical Albertha Beattie, MD  morphine (MS CONTIN) 15 MG 12 hr tablet Take 15 mg by mouth every 12 (twelve) hours.   Yes Historical Gurnoor Sloop, MD  nitroGLYCERIN (NITROSTAT) 0.4 MG SL tablet Place 0.4 mg under the  tongue every 5 (five) minutes as needed. For chest pain.   Yes Historical Kayra Crowell, MD  polyethylene glycol (MIRALAX / GLYCOLAX) packet Take 17 g by mouth daily.   Yes Historical Kenai Fluegel, MD  Potassium Chloride Crys CR (KLOR-CON M10 PO) Take 2 tablets by mouth daily.    Yes Historical Shamina Etheridge, MD   Physical Exam: Filed Vitals:   06/16/14 0701 06/16/14 0725 06/16/14 0725 06/16/14 0806  BP: 120/73 120/73    Pulse: 87   87  Temp:      TempSrc:      Resp: 20 22  18   Height:      Weight:      SpO2: 99% 97% 97% 99%     General:  Caucasian male, BiPAP mask in place, comfortable-appearing, in no acute distress  Eyes:  Left pupil 4 mm, right pupil 2 mm, chronic.  anicteric, non-injected.  ENT:  Nares clear.   MMM.  Neck:  Supple without TM or JVD.    Lymph:  No cervical, supraclavicular, or submandibular LAD.  Cardiovascular:  RRR, normal S1, S2,  no obvious  m/r/g.  2+ pulses, warm extremities  Respiratory:   coarse rales at the bilateral bases, no obvious wheezes or rhonchi  Abdomen:  NABS.  Soft, ND/NT.    Skin:  No rashes or focal lesions.  Musculoskeletal:  Normal bulk and tone.   1+ bilateral LE edema.  Psychiatric:  A & O x 4.  Appropriate affect.  Neurologic:  CN 3-12 intact.  5-/5 strength throughout .  Sensation intact.  Labs on Admission:  Basic Metabolic Panel:  Recent Labs Lab 06/16/14 0619  NA 140  K 4.0  CL 103  GLUCOSE 385*  BUN 13  CREATININE 0.80   Liver Function Tests: No results found for this basename: AST, ALT, ALKPHOS, BILITOT, PROT, ALBUMIN,  in the last 168 hours No results found for this basename: LIPASE, AMYLASE,  in the last 168 hours No results found for this basename: AMMONIA,  in the last 168 hours CBC:  Recent Labs Lab 06/16/14 0603 06/16/14 0619  WBC 11.2*  --   HGB 15.4 17.7*  HCT 46.6 52.0  MCV 98.3  --   PLT 192  --    Cardiac Enzymes:  Recent Labs Lab 06/16/14 0603  TROPONINI <0.30    BNP (last 3  results)  Recent Labs  06/16/14 0603  PROBNP 991.2*   CBG: No results found for this basename: GLUCAP,  in the last 168 hours  Radiological Exams on Admission: Dg Chest Portable 1 View  06/16/2014   CLINICAL DATA:  Respiratory distress. Shortness of breath. Chest tightness.  EXAM: PORTABLE CHEST - 1 VIEW  COMPARISON:  03/04/2014  FINDINGS: Cardiac enlargement without vascular congestion. Increasing masslike opacities in the lung bases bilaterally and in the left upper lungs, likely corresponding with lesions on previous CT from 04/21/2014. There is suggestion of progression in the left upper lung since the CT scan. This could represent progression of metastatic disease or superimposed pneumonia or consolidation. Probable left pleural effusion. No pneumothorax.  IMPRESSION: Increasing nodular opacities in the lung bases and left upper lung may indicate progression of metastatic disease and/or superimposed consolidative process.   Electronically Signed   By: Lucienne Capers M.D.   On: 06/16/2014 06:20    EKG: Independently reviewed. LBBB, similar to prior  Assessment/Plan Active Problems:   HYPERTENSION   COPD UNSPECIFIED   CAD (coronary artery disease)   Acute on chronic systolic CHF (congestive heart failure), NYHA class 3   Dyspnea   4.1 cm well differentiated adenocarcinoma of the left lower lobe of the lung (T2a N0 M0 - Stage IB)   Diabetes mellitus   Acute on chronic respiratory failure with hypoxia   Acute on chronic systolic heart failure  ---  Acute on chronic hypoxic respiratory failure. Differential diagnosis includes COPD exacerbation, acute on chronic systolic heart failure, pneumonia, pulmonary embolism, ACS, progression of cancer.  Patient is currently feeling much better on BiPAP.  Rales on exam suggests pulmonary edema, however this may also be secondary to pneumonia or pulmonary embolism.  Patient did not sound diminished her wheezing to suggest COPD exacerbation. -   Admit to step down -  Telemetry -  Cycle troponin -  Aspirin -  Continue low-dose beta blocker -  Patient is hospice care and will likely die from his malignancy, so  we will defer statin -  Daily weights, strict ins and outs -  Continue Lasix 40 mg IV once daily -  Stat CT angio chest to rule out pulmonary embolism -  Blood cultures -  Start levofloxacin -  Strep pneumo, Legionella antigens -  Sputum culture if able -  Duo nebs every 4 hours -  Trial off BiPAP -  Bipap prn  CAD, status post stent, not on aspirin -  Restart aspirin as above -  Continue carvedilol -  Defer statin for now  Hypertension, well controlled.   COPD, likely not an acute exacerbation -  Duo nebs every 4 hours  Diabetes mellitus, blood sugars elevated in ER  -  hemoglobin A1c -  Decrease Lantus to 50 units  -  Start sliding scale insulin  Progressive NSCLC, hospice care.  Diet:  NPO until off bipap for a while Access:  PIV IVF:  off Proph:  lovenox  Code Status: DNR/DNI, but bipap okay Family Communication: spoke with patient, wife, and daughters Disposition Plan: Admit to stepdown  Time spent: 60 min Janece Canterbury [facility] Pager [phone]  If 7PM-7AM, please contact night-coverage [email or web address] Password [facility] 06/16/2014, 8:51 AM

## 2014-06-16 NOTE — ED Provider Notes (Addendum)
CSN: [id number]     Arrival date & time 06/16/14  0601 History   First MD Initiated Contact with Patient 06/16/14 [phone]     Chief Complaint  Patient presents with  . Respiratory Distress     (Consider location/radiation/quality/duration/timing/severity/associated sxs/prior Treatment) HPI Comments: 80 year old male with COPD, 2 L home oxygen, left bundle branch block, CAD, congestive heart failure, high blood pressure history presents with acute shortness of breath that woke him from sleep. Patient denies similar symptoms in the past. Patient has had 2 or 3 pounds of weight gain recently. Chronic mild leg swelling. No worsening exertional symptoms recently and patient had mild anterior nonradiating chest tightness with shortness of breath that resolve after BiPAP. Patient just started hospice but would like BiPAP and medical treatment however does not want intubation, CPR or any other aggressive measures.  The history is provided by the patient.    Past Medical History  Diagnosis Date  . Diabetes mellitus   . Hyperlipidemia   . 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 Shortness of breath   . Blood transfusion   . GERD (gastroesophageal reflux disease)   . Coronary artery disease   . CHF (congestive heart failure)   . Dysrhythmia   . Peripheral vascular disease   . Anemia   . Depression   . COPD (chronic obstructive pulmonary disease)     PFT's 04/15/10  FEV1 2.50 (92%) ratio 68 DLC0 78%  . Lung mass 08/21/12    left lower, adenocarcinoma  . 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 Depression   . History of radiation therapy 09/25/12,09/27/12,10/01/12,10/05/12,&10/08/12    LLLlung 50Gy/27fx   Past Surgical History  Procedure Laterality Date  . Cataract extraction    . Colonoscopy  2012  . Enteroscopy  12/16/2011    Procedure: ENTEROSCOPY;  Surgeon: Beryle Beams,  MD;  Location: [facility];  Service: Endoscopy;  Laterality: N/A;  . Coronary angioplasty with stent placement    . Mastectomy      Had when 24months old  . Tonsillectomy    . Lung biopsy  08/21/12    LLL   Family History  Problem Relation Age of Onset  . Colon cancer Brother     lung cancer - NOT a smoker  . Prostate cancer Brother   . Lung cancer Mother   . Hypertension Mother    History  Substance Use Topics  . Smoking status: Former Smoker -- 1.00 packs/day for 25 years    Types: Cigarettes    Quit date: 02/23/1982  . Smokeless tobacco: Never Used  . Alcohol Use: No    Review of Systems  Constitutional: Negative for fever and chills.  HENT: Negative for congestion.   Eyes: Negative for visual disturbance.  Respiratory: Positive for chest tightness and shortness of breath.   Cardiovascular: Positive for leg swelling. Negative for chest pain.  Gastrointestinal: Negative for vomiting and abdominal pain.  Genitourinary: Negative for dysuria and flank pain.  Musculoskeletal: Negative for back pain, neck pain and neck stiffness.  Skin: Negative for rash.  Neurological: Negative for light-headedness and headaches.      Allergies  Review of patient's allergies indicates no known allergies.  Home Medications   Prior to Admission medications   Medication Sig Start Date End Date Taking? Authorizing Provider  aspirin 81 MG tablet  Take 81 mg by mouth daily.    Historical Provider, MD  atorvastatin (LIPITOR) 40 MG tablet Take 20 mg by mouth daily.    Historical Provider, MD  brimonidine (ALPHAGAN) 0.2 % ophthalmic solution 1 drop 3 (three) times daily.    Historical Provider, MD  carvedilol (COREG) 3.125 MG tablet Take 3.125 mg by mouth 2 (two) times daily with a meal. 10/03/11   Josue Hector, MD  cetirizine (ZYRTEC) 10 MG tablet Take 10 mg by mouth daily.      Historical Provider, MD  citalopram (CELEXA) 40 MG tablet Take 40 mg by mouth daily. 11/21/11   Annita Brod,  MD  clopidogrel (PLAVIX) 75 MG tablet Take 600 mg today. Then starting 08/18/12 take 1 tablet daily 08/17/12   Josue Hector, MD  cyclobenzaprine (FLEXERIL) 10 MG tablet Take 10 mg by mouth as needed.    Historical Provider, MD  furosemide (LASIX) 20 MG tablet Take 20 mg by mouth daily.    Historical Provider, MD  insulin glargine (LANTUS) 100 UNIT/ML injection Inject 50 Units into the skin daily.     Historical Provider, MD  insulin regular (HUMULIN R) 100 UNIT/ML injection Inject 16 Units into the skin 4 (four) times daily. Sliding scale    Historical Provider, MD  losartan (COZAAR) 100 MG tablet Take 50 mg by mouth daily.    Historical Provider, MD  meclizine (MEDI-MECLIZINE) 25 MG tablet Take 25 mg by mouth 3 (three) times daily as needed. For dizziness.    Historical Provider, MD  metFORMIN (GLUCOPHAGE) 500 MG tablet Take 500 mg by mouth 3 (three) times daily.    Historical Provider, MD  nitroGLYCERIN (NITROSTAT) 0.4 MG SL tablet Place 0.4 mg under the tongue every 5 (five) minutes as needed. For chest pain.    Historical Provider, MD  oxyCODONE-acetaminophen (ROXICET) 5-325 MG per tablet Take 1 tablet by mouth every 4 (four) hours as needed. 11/26/13   Susy Frizzle, MD  Potassium Chloride Crys CR (KLOR-CON M10 PO) Take 2 tablets by mouth daily.     Historical Provider, MD  zolpidem (AMBIEN) 10 MG tablet Take 10 mg by mouth at bedtime as needed.    Historical Provider, MD   BP 114/65  Pulse 91  Temp(Src) 98.5 F (36.9 C) (Rectal)  Resp 23  Ht 6' (1.829 m)  Wt 197 lb (89.359 kg)  BMI 26.71 kg/m2  SpO2 99% Physical Exam  Nursing note and vitals reviewed. Constitutional: He is oriented to person, place, and time. He appears well-developed and well-nourished.  HENT:  Head: Normocephalic and atraumatic.  Eyes: Conjunctivae are normal. Right eye exhibits no discharge. Left eye exhibits no discharge.  Neck: Normal range of motion. Neck supple. No tracheal deviation present.   Cardiovascular: Normal rate and regular rhythm.   Pulmonary/Chest: Respiratory distress: increased respiratory effort, on BiPAP. He has rales (crackles bilateral lower and mid lung fields.).  Abdominal: Soft. He exhibits distension (mild). There is no tenderness. There is no guarding.  Musculoskeletal: He exhibits edema (mild lower extremities bilateral).  Neurological: He is alert and oriented to person, place, and time.  Skin: Skin is warm. No rash noted.  Psychiatric: He has a normal mood and affect.    ED Course  Procedures (including critical care time) Emergency Ultrasound: Limited Thoracic Performed and interpreted by Dr Reather Converse Longitudinal view of anterior left and right lung fields in real-time with linear probe. Indication: dyspnea, chf hx Findings: pos lung sliding pos B lines Interpretation:  no evidence of pneumothorax.mild pulm edema Images electronically archived.         EMERGENCY DEPARTMENT [place] CARDIAC EXAM "Study: Limited Ultrasound of the heart and pericardium"  INDICATIONS:Dyspnea Multiple views of the heart and pericardium were obtained in real-time with a multi-frequency probe.  PERFORMED UM:PNTIRW  IMAGES ARCHIVED?: Yes  FINDINGS: No pericardial effusion, Decreased contractility and Tamponade physiology absent  LIMITATIONS:  Body habitus  VIEWS USED: Subcostal 4 chamber, Parasternal long axis and Parasternal short axis  INTERPRETATION: Cardiac activity present, Pericardial effusioin absent, Cardiac tamponade absent and Decreased contractility   CRITICAL CARE Performed by: Mariea Clonts   Total critical care time: 30 min  Critical care time was exclusive of separately billable procedures and treating other patients.  Critical care was necessary to treat or prevent imminent or life-threatening deterioration.  Critical care was time spent personally by me on the following activities: development of treatment plan with patient and/or surrogate  as well as nursing, discussions with consultants, evaluation of patient's response to treatment, examination of patient, obtaining history from patient or surrogate, ordering and performing treatments and interventions, ordering and review of laboratory studies, ordering and review of radiographic studies, pulse oximetry and re-evaluation of patient's condition.  Labs Review Labs Reviewed  CBC - Abnormal; Notable for the following:    WBC 11.2 (*)    All other components within normal limits  PRO B NATRIURETIC PEPTIDE - Abnormal; Notable for the following:    Pro B Natriuretic peptide (BNP) 991.2 (*)    All other components within normal limits  I-STAT CHEM 8, ED - Abnormal; Notable for the following:    Glucose, Bld 385 (*)    Hemoglobin 17.7 (*)    All other components within normal limits  TROPONIN I  I-STAT TROPOININ, ED    Imaging Review Dg Chest Portable 1 View  06/16/2014   CLINICAL DATA:  Respiratory distress. Shortness of breath. Chest tightness.  EXAM: PORTABLE CHEST - 1 VIEW  COMPARISON:  03/04/2014  FINDINGS: Cardiac enlargement without vascular congestion. Increasing masslike opacities in the lung bases bilaterally and in the left upper lungs, likely corresponding with lesions on previous CT from 04/21/2014. There is suggestion of progression in the left upper lung since the CT scan. This could represent progression of metastatic disease or superimposed pneumonia or consolidation. Probable left pleural effusion. No pneumothorax.  IMPRESSION: Increasing nodular opacities in the lung bases and left upper lung may indicate progression of metastatic disease and/or superimposed consolidative process.   Electronically Signed   By: Lucienne Capers M.D.   On: 06/16/2014 06:20     EKG Interpretation   Date/Time:  Monday June 16 2014 06:03:56 EDT Ventricular Rate:  96 PR Interval:  166 QRS Duration: 182 QT Interval:  453 QTC Calculation: 573 R Axis:     Text Interpretation:   Ectopic atrial rhythm Left bundle branch block No  significant change since last tracing Confirmed by KNAPP  MD-J, JON  ([id number]) on 06/16/2014 8:11:23 AM      MDM   Final diagnoses:  Acute congestive heart failure, unspecified congestive heart failure type  Acute pulmonary edema  Dyspnea  LBBB (left bundle branch block)   Patient presented after acute respiratory distress which improved significantly with BiPAP. Patient nitroglycerin on route which includes blood pressure as patient was hypertensive initially. Patient did not BiPAP and saturations dropped below 90. Bedside ultrasound showed B. lines, no effusion and decreased ejection fraction. BiPAP ordered and nitro paste. EKG reviewed and  left bundle branch block overall similar previous. I did discuss with interventional cardiology prior to patient arrival as him as EKG showed scar posterior criteria with left bundle branch block. Cardiologist recommended treating clinically and repeat EKG on arrival and no STEMI to be called. Lasix ordered.  I clarified with patient and family that he does want treatment for his heart failure although he is hospice. The patients results and plan were reviewed and discussed.   Any x-rays performed were personally reviewed by myself.  Discussed with [facility] we discussed acute heart failure versus pulmonary embolism. Return also to evaluate the patient and discuss whether CT angiogram before admission. Patient will be admitted to step down either way. Differential diagnosis were considered with the presenting HPI.  Medications  aspirin chewable tablet 324 mg (324 mg Oral Given 06/16/14 0617)  furosemide (LASIX) injection 40 mg (40 mg Intravenous Given 06/16/14 0631)  nitroGLYCERIN (NITROGLYN) 2 % ointment 1 inch (1 inch Topical Given 06/16/14 0631)    Filed Vitals:   06/16/14 9892 06/16/14 0608 06/16/14 0622 06/16/14 0630  BP: 138/85 138/85 138/85 114/65  Pulse: 95 93 94 91  Temp:   98.5 F  (36.9 C)   TempSrc:   Rectal   Resp: 20 30 26 23   Height:  6' (1.829 m)    Weight:  197 lb (89.359 kg)    SpO2: 100% 100% 100% 99%    Admission/ observation were discussed with the admitting physician, patient and/or family and they are comfortable with the plan.        Mariea Clonts, MD 06/16/14 1194  Mariea Clonts, MD 06/16/14 1740  Mariea Clonts, MD 06/16/14 8144  Mariea Clonts, MD 06/28/14 1700

## 2014-06-16 NOTE — ED Notes (Signed)
Pt still tolerating nasal cannula at 96%. Provided water.

## 2014-06-16 NOTE — ED Notes (Signed)
Watch, Cut shirt, pajama pants and boxers, with brown bedroom shoes placed in bag with patient label on stretcher with patient aware.

## 2014-06-16 NOTE — Progress Notes (Signed)
Pt currently active with [facility] for Hospice services.  Resumption of care requested. Anderson Malta of [employer] notified.  No DME needs identified at this time.

## 2014-06-17 DIAGNOSIS — I509 Heart failure, unspecified: Secondary | ICD-10-CM

## 2014-06-17 DIAGNOSIS — C3430 Malignant neoplasm of lower lobe, unspecified bronchus or lung: Secondary | ICD-10-CM

## 2014-06-17 DIAGNOSIS — I5023 Acute on chronic systolic (congestive) heart failure: Principal | ICD-10-CM

## 2014-06-17 DIAGNOSIS — K219 Gastro-esophageal reflux disease without esophagitis: Secondary | ICD-10-CM

## 2014-06-17 DIAGNOSIS — J189 Pneumonia, unspecified organism: Secondary | ICD-10-CM

## 2014-06-17 LAB — BASIC METABOLIC PANEL
BUN: 19 mg/dL (ref 6–23)
CO2: 27 meq/L (ref 19–32)
CREATININE: 0.88 mg/dL (ref 0.50–1.35)
Calcium: 9.6 mg/dL (ref 8.4–10.5)
Chloride: 96 mEq/L (ref 96–112)
GFR, EST AFRICAN AMERICAN: 89 mL/min — AB (ref >90)
GFR, EST NON AFRICAN AMERICAN: 77 mL/min — AB (ref >90)
GLUCOSE: 124 mg/dL — AB (ref 70–99)
POTASSIUM: 3.7 meq/L (ref 3.7–5.3)
Sodium: 141 mEq/L (ref 137–147)

## 2014-06-17 LAB — HEMOGLOBIN A1C
Hgb A1c MFr Bld: 10.2 % — ABNORMAL HIGH (ref <5.7)
MEAN PLASMA GLUCOSE: 246 mg/dL — AB (ref <117)

## 2014-06-17 LAB — CBC
HEMATOCRIT: 43.6 % (ref 39.0–52.0)
Hemoglobin: 14.7 g/dL (ref 13.0–17.0)
MCH: 32.7 pg (ref 26.0–34.0)
MCHC: 33.7 g/dL (ref 30.0–36.0)
MCV: 96.9 fL (ref 78.0–100.0)
Platelets: 198 10*3/uL (ref 150–400)
RBC: 4.5 MIL/uL (ref 4.22–5.81)
RDW: 13.5 % (ref 11.5–15.5)
WBC: 9 10*3/uL (ref 4.0–10.5)

## 2014-06-17 LAB — GLUCOSE, CAPILLARY
Glucose-Capillary: 109 mg/dL — ABNORMAL HIGH (ref 70–99)
Glucose-Capillary: 297 mg/dL — ABNORMAL HIGH (ref 70–99)

## 2014-06-17 LAB — TROPONIN I: Troponin I: <0.3 ng/mL (ref <0.30)

## 2014-06-17 MED ORDER — FUROSEMIDE 40 MG PO TABS: 40.0000 mg | ORAL_TABLET | Freq: Every day | ORAL | Status: AC

## 2014-06-17 MED ORDER — IPRATROPIUM-ALBUTEROL 0.5-2.5 (3) MG/3ML IN SOLN: 3.0000 mL | RESPIRATORY_TRACT | Status: AC | PRN

## 2014-06-17 MED ORDER — LEVOFLOXACIN 750 MG PO TABS: 750.0000 mg | ORAL_TABLET | Freq: Every day | ORAL | Status: AC

## 2014-06-17 NOTE — Progress Notes (Signed)
Resident of [facility]- in their [place] unit. He is followed by [facility] for Hospice care.  Overnight stay- ok to retun to Independent living per MD.  No CSW needs identified.  CSW signing off but will be available to assist as needed.  Independent living arrangement confirmed with Anderson Malta- Admissions at [facility].  Lorie [place]. [place], [place]   .

## 2014-06-17 NOTE — Progress Notes (Signed)
Patient given discharge instructions and all questions answered.  Pt. Discharged via wheelchair with all belongings.   

## 2014-06-17 NOTE — Discharge Summary (Signed)
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 year-old male with history of recurrent non-small cell lung cancer of the right lung with progression, currently on hospice care,  COPD with chronic respiratory failure on 2 L home oxygen, chronic systolic heart failure with ejection fraction of 25-30%, CAD status post stent, hypertension, hyperlipidemia, diabetes mellitus who presents with acute onset shortness of breath. The patient was last at their baseline health over the last week. He was set up with home oxygen about one week ago and his pain medication was changed to morphine. Since that time, he has been feeling considerably better. He went out with his family for Father's Day dinner last night, and was feeling very well. He has not had recent fevers, chills, increased cough,  shortness of breath with exertion, orthopnea, increased swelling of his legs. He slept well for the initial part of the night, but then awoke suddenly this morning short of breath. His family states that he acted like he had a difficult time breathing, however they did not notice any boles, wet sounds, wheezes. They called EMS who immediately put him on BiPAP. His oxygen saturations were around 80% and he had severe difficulty breathing. He was transported to the emergency department.  In the emergency department, his labs are notable for white blood cell count of 11.2, hemoglobin 15.4, platelets 192. His troponin was negative. His BMP was notable for a glucose of 385. Chest x-ray demonstrated nodular opacities in the lung bases and left upper long consistent with progression of metastatic disease. He had cardiac enlargement without vascular congestion but he did have a probable left pleural effusion. He received Lasix 40 mg IV once, aspirin, nitroglycerin 1 inch. He is hospice care, DO NOT RESUSCITATE/DO NOT INTUBATE, however he is tolerating his BiPAP well. If he has a reversible condition, he is willing to continue BiPAP until the condition can be reversed. He would like to return home at discharge with home hospice.   Hospital Course:  1-Acute on chronic resp failure with hypoxia: multifactorial. Due to progression of lung malignancy, post obstructive PNA and also due to exacerbation of systolic heart failure. -will discharge with adjusted dose of lasix and instructions to follow low sodium diet -patient instructed to check his weight on daily basis -might required further adjustments on his lasix to keep volume stable -  will treat for a total of 8 days with levaquin for postobstructive PNE -continue now with duonebs nebulizer for better control of keeping airways open -flutter valve to help expanding lungs -patient instruucted to wear his oxygen 24/7 and to use as needed roxanol for air hunger and  SOB.  2-DM: continue current hypoglycemic regimen  3-CAD: no CP -continue carvedilol and ASA  4-HTN: stable. Continue current antihypertensive regimen -patient advised to follow heart healthy diet  5-Progressive NSCLC: continue hospice care    Procedures: See below for x-ray reports   Consultations:  None   Discharge Exam: Filed Vitals:   06/17/14 0557  BP: 121/60  Pulse: 78  Temp: 97.6 F (36.4 C)  Resp: 18    General: breathing a lot better; no CP and afebrile Cardiovascular: S1 and S2, no rubs or gallops Respiratory: scattered rhonchi; decreased BS at bases; no frank crackles Extremities: trace edema bilaterally Neuro: no new focal deficit  Discharge Instructions You were cared for by a hospitalist during your hospital stay. If you have any questions about your discharge medications or the care you received while you were in the hospital after you are discharged, you can call the unit and asked to speak with the hospitalist on call if the hospitalist that took care of you is not available. Once you are discharged, your primary care physician will handle any further medical issues. Please note that NO REFILLS for any discharge medications will be authorized once you are discharged, as it is imperative that you return to your primary care physician (or establish a relationship with a primary care physician if you do not have one) for your aftercare needs so that they can reassess your need for medications and monitor your lab values.  Discharge Instructions   Diet - low sodium heart healthy    Complete by:  As directed      Discharge instructions    Complete by:  As directed   Take medications as prescribed Wear your oxygen 24/7  Lasix has been changed to 40mg  daily  Follow a low sodium diet (no more than 2 sodium per day) Check your weight on daily basis Use roxanol as needed to palliate SOB and air hunger sensation            Medication List    STOP taking  these medications       albuterol (2.5 MG/3ML) 0.083% nebulizer solution  Commonly known as:  PROVENTIL      TAKE these medications       brimonidine 0.2 % ophthalmic solution  Commonly known as:  ALPHAGAN  Place 1 drop into both eyes 3 (three) times daily.     carvedilol 3.125 MG tablet  Commonly known as:  COREG  Take 3.125 mg by mouth 2 (two) times daily with a meal.     ferrous sulfate 325 (65 FE) MG tablet  Take 325 mg by mouth daily with breakfast.     furosemide 40 MG tablet  Commonly known as:  LASIX  Take 1 tablet (40 mg total) by mouth daily.     insulin glargine 100 UNIT/ML injection  Commonly known as:  LANTUS  Inject 70 Units into the skin daily.     ipratropium-albuterol 0.5-2.5 (3) MG/3ML Soln  Commonly known as:  DUONEB  Take 3 mLs by nebulization every 4 (four) hours as needed.     KLOR-CON M10 PO  Take 2 tablets by mouth daily.     levofloxacin 750 MG tablet  Commonly known as:  LEVAQUIN  Take 1 tablet (750 mg total) by mouth daily.     morphine 15 MG 12 hr tablet  Commonly known as:  MS CONTIN  Take 15 mg by mouth every 12 (twelve) hours.     nitroGLYCERIN 0.4 MG SL tablet  Commonly known as:  NITROSTAT  Place 0.4 mg under the tongue every 5 (five) minutes as needed. For chest pain.     polyethylene glycol packet  Commonly known as:  MIRALAX / GLYCOLAX  Take 17 g by mouth daily.       No Known Allergies     Follow-up Information   Follow up with [facility], MD In 10 days.   Specialty:  Family Medicine   Contact information:   [address] [place] [address] [phone]       The results of significant diagnostics from this hospitalization (including imaging, microbiology, ancillary and laboratory) are listed below for reference.    Significant Diagnostic Studies: Ct Angio Chest Pe W/cm &/or Wo Cm  06/16/2014   CLINICAL DATA:  Lung cancer with new, sudden onset of shortness of breath. Evaluate for pulmonary  embolus.  EXAM: CT ANGIOGRAPHY CHEST WITH CONTRAST  TECHNIQUE: Multidetector CT imaging of the chest was performed using the standard protocol during bolus administration of intravenous contrast. Multiplanar CT image reconstructions and MIPs were obtained to evaluate the vascular anatomy.  CONTRAST:  178mL OMNIPAQUE IOHEXOL 350 MG/ML SOLN  COMPARISON:  Recent CT scan of the chest 04/21/2014  FINDINGS: Mediastinum: Unremarkable CT appearance of the thyroid gland. No suspicious mediastinal or hilar adenopathy. No soft tissue mediastinal mass. The thoracic esophagus is unremarkable.  Heart/Vascular: Excellent opacification of the pulmonary arteries to the proximal subsegmental level. No evidence of central filling defect to suggest acute pulmonary embolus. However, there is a focal abnormality in the left lower lobar pulmonary artery manifested by a marked eccentric thickening of the anterior and lateral walls of the vessel resulting in distal occlusion of the vessel leading up to a segment of chronic atelectatic lung. 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 No evidence of aortic dilatation or acute intramural hemorrhage. Stable cardiomegaly with mild left ventricular dilatation. Small pericardial effusion is unchanged compared to the recent prior CT scan.  Lungs/Pleura: Enlarging left layering pleural effusion. New small layering right pleural effusion. Ground-glass attenuation opacity noted dependently along the right major fissure as well as a throughout the left upper lung concerning for mild asymmetric pulmonary edema. Chronic atelectasis of the left lower lobe is similar compared to prior. Right lower lobe pulmonary nodules are similar in size at 5 and 8 mm respectively. Precise measurements ir somewhat limited by associated right lower lobe atelectasis.  Interval progression of the dense consolidation in the right lower lobe in the region of the previously identified mass. Measurements of the mass can no longer be performed accurately. Irregular spiculated mass in the inferior aspect of the right middle lobe is slightly more full than previously seen measuring approximately 3.3 x 2.4 cm. Again, precise measurements are difficult given the relatively amorphous nature of the lesion and associated architectural distortion. Diffuse bilateral lower lobe bronchial wall thickening.  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 No new acute fracture or aggressive appearing osseous lesion identified.  Upper Abdomen: Visualized upper abdominal organs are unremarkable.  Review of the MIP images confirms the above findings.  IMPRESSION: 1. Negative for acute pulmonary embolus. 2. Combination of enlarging left pleural effusion, new small right pleural effusion and asymmetric ground-glass attenuation opacity in the left greater than right lung suggests volume overload versus mild CHF. Mild CHF is favored given cardiomegaly and mild left ventricular dilatation. 3. Chronic eccentric wall thickening involving the left lobar pulmonary artery resulting in occlusion of the artery as it enters the chronically atelectatic inferior left lower lobe. 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 4. There may be mild interval enlargement of the spiculated pulmonary nodule in the inferior aspect of the right middle lobe. The other previously measured pulmonary nodules are difficult to measure today secondary to lower lung volumes and obscuring atelectasis. 5. Dense opacity in the right lower lobe favored to reflect the previously noted pulmonary mass with superimposed atelectasis in the setting of the new small pleural effusion.  If the patient had fever, leukocytosis and cough, superimposed pneumonia would be difficult to exclude. 6. 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 7. Additional ancillary findings as above without significant interval change.   Electronically Signed   By: Jacqulynn Cadet M.D.   On: 06/16/2014 09:51   Dg Chest Portable 1 View  06/16/2014   CLINICAL DATA:  Respiratory distress. Shortness of breath. Chest tightness.  EXAM: PORTABLE CHEST - 1 VIEW  COMPARISON:  03/04/2014  FINDINGS: Cardiac enlargement without vascular congestion. Increasing masslike opacities in the lung bases bilaterally and in the left upper lungs, likely corresponding with lesions on previous CT from 04/21/2014. There is suggestion of progression in the left upper lung since the CT scan. This could represent progression of metastatic disease or superimposed pneumonia or consolidation. Probable left pleural effusion. No pneumothorax.  IMPRESSION: Increasing nodular opacities in the lung bases and left upper lung may indicate progression of metastatic disease and/or superimposed consolidative process.   Electronically Signed   By: Lucienne Capers M.D.   On: 06/16/2014 06:20    Microbiology: Recent Results (from the past 240 hour(s))  CULTURE, BLOOD (ROUTINE X 2)     Status: None   Collection Time    06/16/14 12:40 PM      Result Value Ref Range Status   Specimen Description BLOOD RIGHT HAND   Final   Special Requests BOTTLES DRAWN AEROBIC AND ANAEROBIC 2.5CC   Final   Culture  Setup Time     Final   Value: 06/16/2014 16:25     Performed at [facility]   Culture     Final   Value:        BLOOD CULTURE RECEIVED NO GROWTH TO DATE CULTURE WILL BE HELD FOR 5 DAYS BEFORE ISSUING A FINAL NEGATIVE REPORT     Performed at [facility]   Report Status PENDING   Incomplete  CULTURE, BLOOD (ROUTINE X 2)     Status: None    Collection Time    06/16/14 12:40 PM      Result Value Ref Range Status   Specimen Description BLOOD LEFT HAND   Final   Special Requests BOTTLES DRAWN AEROBIC AND ANAEROBIC 5CC   Final   Culture  Setup Time     Final   Value: 06/16/2014 16:25     Performed at [facility]  Final   Value:        BLOOD CULTURE RECEIVED NO GROWTH TO DATE CULTURE WILL BE HELD FOR 5 DAYS BEFORE ISSUING A FINAL NEGATIVE REPORT     Performed at [facility]   Report Status PENDING   Incomplete     Labs: Basic Metabolic Panel:  Recent Labs Lab 06/16/14 0619 06/17/14 0401  NA 140 141  K 4.0 3.7  CL 103 96  CO2  --  27  GLUCOSE 385* 124*  BUN 13 19  CREATININE 0.80 0.88  CALCIUM  --  9.6   CBC:  Recent Labs Lab 06/16/14 0603 06/16/14 0619 06/17/14 0401  WBC 11.2*  --  9.0  HGB 15.4 17.7* 14.7  HCT 46.6 52.0 43.6  MCV 98.3  --  96.9  PLT 192  --  198   Cardiac Enzymes:  Recent Labs Lab 06/16/14 0603 06/16/14 1243 06/16/14 1641 06/16/14 2305  TROPONINI <0.30 <0.30 <0.30 <0.30   BNP: BNP (last 3 results)  Recent Labs  06/16/14 0603  PROBNP 991.2*   CBG:  Recent Labs Lab 06/16/14 1325 06/16/14 1638 06/16/14 2156 06/17/14 0702 06/17/14 1100  GLUCAP 339* 379* 308* 109* 297*    Signed:  Barton Dubois  [facility] 06/17/2014, 12:24 PM

## 2014-06-17 NOTE — Care Management Note (Signed)
    Page 1 of 2   06/17/2014     2:25:16 PM CARE MANAGEMENT NOTE 06/17/2014  Patient:  Hunter Howard, Hunter Howard   Account Number:  [id number]  Date Initiated:  06/17/2014  Documentation initiated by:  [facility]  Subjective/Objective Assessment:   80 y.o. year-old male with history of recurrent non-small cell lung cancer of the right lung with progression, currently on hospice care.//From [place] with [facility].     Action/Plan:   -  Telemetry  -  Cycle troponin  -  Continue low-dose beta blocker  -  Patient is hospice care and will likely die from his malignancy  -  Daily weights, strict ins and outs  - IV lasix.//Assist with resumption of Home Hospice   Anticipated DC Date:  06/17/2014   Anticipated DC Plan:  [place]  CM consult      PAC Choice  HOSPICE   Choice offered to / List presented to:             [place]   Status of service:  Completed, signed off Medicare Important Message given?  NA - LOS <3 / Initial given by admissions (If response is "NO", the following Medicare IM given date fields will be blank) Date Medicare IM given:   Date Additional Medicare IM given:    Discharge Disposition:  [place]  Per UR Regulation:  Reviewed for med. necessity/level of care/duration of stay  If discussed at [place] of Stay Meetings, dates discussed:    Comments:  06/17/14 John Day, [place], BSN, [place] [phone] LE 06/16/14/1400 Spoke to pt upon admission to floor regarding Home Hospice services.  Pt could not remember name of agency and family wasd not available.  NCM calledl around to confirm that pt was active with [facility].  Anderson Malta, RN of [facility] came to hospital to discharge him from their service.  Pt very tearful and stating that he has made a mistake- that he did not want lab work-ups and tests and wanted to be discharged.  NCM called Dr. Sheran Fava to inform her of  pt wishes; she spoke with pt and pt agreed to seek treatment for the night.  NCM at bedside to speak with pt and family.  Pt ready to return home and continue with Home Hospice care.  [place] and intake RN visited pt and family to assure smooth transition.

## 2014-06-22 LAB — CULTURE, BLOOD (ROUTINE X 2)
CULTURE: NO GROWTH
Culture: NO GROWTH

## 2014-07-01 ENCOUNTER — Inpatient Hospital Stay: Payer: Medicare Other | Admitting: Family Medicine

## 2014-08-26 DEATH — deceased

## 2014-09-12 ENCOUNTER — Telehealth: Payer: Self-pay

## 2014-09-12 NOTE — Telephone Encounter (Signed)
Patient died @ [facility] per [employer]

## 2014-12-04 ENCOUNTER — Encounter (HOSPITAL_COMMUNITY): Payer: Self-pay | Admitting: Cardiovascular Disease
